# Patient Record
Sex: Male | Born: 1975 | Race: Black or African American | Hispanic: No | Marital: Single | Smoking: Former smoker
Health system: Southern US, Community
[De-identification: ages and names within clinical notes are randomized; demographics above are authoritative.]

## PROBLEM LIST (undated history)

## (undated) DIAGNOSIS — M545 Low back pain, unspecified: Secondary | ICD-10-CM

---

## 2002-02-19 ENCOUNTER — Emergency Department (HOSPITAL_COMMUNITY): Admission: EM | Admit: 2002-02-19 | Discharge: 2002-02-19 | Payer: Self-pay | Admitting: Emergency Medicine

## 2002-12-23 ENCOUNTER — Emergency Department (HOSPITAL_COMMUNITY): Admission: EM | Admit: 2002-12-23 | Discharge: 2002-12-23 | Payer: Self-pay | Admitting: Emergency Medicine

## 2003-03-18 ENCOUNTER — Emergency Department (HOSPITAL_COMMUNITY): Admission: AD | Admit: 2003-03-18 | Discharge: 2003-03-18 | Payer: Self-pay | Admitting: Family Medicine

## 2006-05-22 ENCOUNTER — Emergency Department (HOSPITAL_COMMUNITY): Admission: EM | Admit: 2006-05-22 | Discharge: 2006-05-22 | Payer: Self-pay | Admitting: Emergency Medicine

## 2008-01-13 ENCOUNTER — Emergency Department (HOSPITAL_COMMUNITY): Admission: EM | Admit: 2008-01-13 | Discharge: 2008-01-13 | Payer: Self-pay | Admitting: Emergency Medicine

## 2012-08-20 ENCOUNTER — Emergency Department (HOSPITAL_COMMUNITY)
Admission: EM | Admit: 2012-08-20 | Discharge: 2012-08-20 | Disposition: A | Discharge status: Home / Self Care | Payer: BC Managed Care – PPO | Attending: Emergency Medicine | Admitting: Emergency Medicine

## 2012-08-20 ENCOUNTER — Encounter (HOSPITAL_COMMUNITY): Payer: Self-pay | Admitting: Emergency Medicine

## 2012-08-20 ENCOUNTER — Emergency Department (HOSPITAL_COMMUNITY): Payer: BC Managed Care – PPO

## 2012-08-20 DIAGNOSIS — J329 Chronic sinusitis, unspecified: Secondary | ICD-10-CM | POA: Insufficient documentation

## 2012-08-20 DIAGNOSIS — R05 Cough: Secondary | ICD-10-CM | POA: Insufficient documentation

## 2012-08-20 DIAGNOSIS — R6883 Chills (without fever): Secondary | ICD-10-CM | POA: Insufficient documentation

## 2012-08-20 DIAGNOSIS — J029 Acute pharyngitis, unspecified: Secondary | ICD-10-CM | POA: Insufficient documentation

## 2012-08-20 DIAGNOSIS — F17200 Nicotine dependence, unspecified, uncomplicated: Secondary | ICD-10-CM | POA: Insufficient documentation

## 2012-08-20 DIAGNOSIS — R059 Cough, unspecified: Secondary | ICD-10-CM | POA: Insufficient documentation

## 2012-08-20 LAB — RAPID STREP SCREEN (MED CTR MEBANE ONLY): Streptococcus, Group A Screen (Direct): NEGATIVE

## 2012-08-20 MED ORDER — DOXYCYCLINE HYCLATE 100 MG PO TABS
100.0000 mg | ORAL_TABLET | Freq: Once | ORAL | Status: AC
  Administered 2012-08-20: 100 mg via ORAL
  Filled 2012-08-20: qty 1

## 2012-08-20 MED ORDER — FEXOFENADINE-PSEUDOEPHED ER 60-120 MG PO TB12: 1.0000 | ORAL_TABLET | Freq: Two times a day (BID) | ORAL | Status: DC

## 2012-08-20 MED ORDER — HYDROCOD POLST-CHLORPHEN POLST 10-8 MG/5ML PO LQCR
5.0000 mL | Freq: Once | ORAL | Status: AC
  Administered 2012-08-20: 5 mL via ORAL
  Filled 2012-08-20: qty 5

## 2012-08-20 MED ORDER — PROMETHAZINE-CODEINE 6.25-10 MG/5ML PO SYRP: 5.0000 mL | ORAL_SOLUTION | Freq: Four times a day (QID) | ORAL | Status: DC | PRN

## 2012-08-20 MED ORDER — DOXYCYCLINE HYCLATE 100 MG PO CAPS: 100.0000 mg | ORAL_CAPSULE | Freq: Two times a day (BID) | ORAL | Status: DC

## 2012-08-20 NOTE — ED Notes (Signed)
nad noted prior to dc. Dc instructions reviewed and 3 scripts given to pt. Ambulated out without difficulty. 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

## 2012-08-20 NOTE — ED Notes (Signed)
Pt c/o sinus congestion, nasal congestion and cough since Monday.

## 2012-08-20 NOTE — ED Provider Notes (Signed)
History     CSN: [id number]  Arrival date & time 08/20/12  2004   First MD Initiated Contact with Patient 08/20/12 2207      Chief Complaint  Patient presents with  . Nasal Congestion    (Consider location/radiation/quality/duration/timing/severity/associated sxs/prior treatment) Patient is a 37 y.o. male presenting with cough. The history is provided by the patient.  Cough Cough characteristics:  Non-productive Severity:  Moderate Duration:  4 days Timing:  Intermittent Progression:  Worsening Chronicity:  New Smoker: yes   Context: sick contacts and weather changes   Relieved by:  Nothing Worsened by:  Nothing tried Ineffective treatments:  Decongestant Associated symptoms: chills, fever, sinus congestion and sore throat   Associated symptoms: no chest pain, no eye discharge, no shortness of breath and no wheezing   Risk factors: no chemical exposure and no recent travel     History reviewed. No pertinent past medical history.  History reviewed. No pertinent past surgical history.  No family history on file.  History  Substance Use Topics  . Smoking status: Current Every Day Smoker    Types: Cigarettes  . Smokeless tobacco: Not on file  . Alcohol Use: Yes      Review of Systems  Constitutional: Positive for fever and chills. Negative for activity change.       All ROS Neg except as noted in HPI  HENT: Positive for sore throat. Negative for nosebleeds and neck pain.   Eyes: Negative for photophobia and discharge.  Respiratory: Positive for cough. Negative for shortness of breath and wheezing.   Cardiovascular: Negative for chest pain and palpitations.  Gastrointestinal: Negative for abdominal pain and blood in stool.  Genitourinary: Negative for dysuria, frequency and hematuria.  Musculoskeletal: Negative for back pain and arthralgias.  Skin: Negative.   Neurological: Negative for dizziness, seizures and speech difficulty.  Psychiatric/Behavioral: Negative  for hallucinations and confusion.    Allergies  Review of patient's allergies indicates no known allergies.  Home Medications   Current Outpatient Rx  Name  Route  Sig  Dispense  Refill  . loratadine (CLARITIN) 10 MG tablet   Oral   Take 10 mg by mouth daily as needed for allergies.           BP 130/87  Temp(Src) 99.4 F (37.4 C) (Oral)  Resp 16  Ht 5\' 8"  (1.727 m)  Wt 192 lb (87.091 kg)  BMI 29.2 kg/m2  SpO2 100%  Physical Exam  Nursing note and vitals reviewed. Constitutional: He is oriented to person, place, and time. He appears well-developed and well-nourished.  Non-toxic appearance.  HENT:  Head: Normocephalic.  Right Ear: Tympanic membrane and external ear normal.  Left Ear: Tympanic membrane and external ear normal.  Nasal congestion  Eyes: EOM and lids are normal. Pupils are equal, round, and reactive to light.  Neck: Normal range of motion. Neck supple. Carotid bruit is not present.  Cardiovascular: Normal rate, regular rhythm, normal heart sounds, intact distal pulses and normal pulses.   Pulmonary/Chest: No respiratory distress. He has rhonchi.  Abdominal: Soft. Bowel sounds are normal. There is no tenderness. There is no guarding.  Musculoskeletal: Normal range of motion.  Lymphadenopathy:       Head (right side): No submandibular adenopathy present.       Head (left side): No submandibular adenopathy present.    He has no cervical adenopathy.  Neurological: He is alert and oriented to person, place, and time. He has normal strength. No cranial nerve deficit or  sensory deficit. He exhibits normal muscle tone. Coordination normal.  Skin: Skin is warm and dry.  Psychiatric: He has a normal mood and affect. His speech is normal.    ED Course  Procedures (including critical care time)  Labs Reviewed  RAPID STREP SCREEN   Dg Chest 2 View  08/20/2012  *RADIOLOGY REPORT*  Clinical Data: Cough and night sweats  CHEST - 2 VIEW  Comparison:  January 13, 2008  Findings:  Lungs clear.  Heart size and pulmonary vascularity are normal.  No adenopathy.  No bone lesions.  IMPRESSION: No abnormality noted.   Original Report Authenticated By: Bretta Bang, M.D.      No diagnosis found.    MDM  I have reviewed nursing notes, vital signs, and all appropriate lab and imaging results for this patient. Patient presents to the emergency department with nasal congestion, cough, and sore throat with cough. He states he has had chills and fever.  Strep test is negative. Chest x-ray is read as negative. Pulse oximetry is 100% on room air. Within normal limits by my interpretation.  Plan the patient will be treated with Allegra-D, doxycycline, prednisone, promethazine cough medication. Patient to return if any changes, problems, or concerns.       Kathie Dike, PA-C 08/20/12 2255

## 2012-08-20 NOTE — ED Provider Notes (Signed)
Medical screening examination/treatment/procedure(s) were performed by non-physician practitioner and as supervising physician I was immediately available for consultation/collaboration. Devoria Albe, MD, Armando Gang   Ward Givens, MD 08/20/12 [phone]

## 2013-11-01 ENCOUNTER — Encounter (HOSPITAL_COMMUNITY): Payer: Self-pay | Admitting: Emergency Medicine

## 2013-11-01 ENCOUNTER — Emergency Department (HOSPITAL_COMMUNITY)
Admission: EM | Admit: 2013-11-01 | Discharge: 2013-11-01 | Disposition: A | Discharge status: Home / Self Care | Payer: BC Managed Care – PPO | Attending: Emergency Medicine | Admitting: Emergency Medicine

## 2013-11-01 ENCOUNTER — Emergency Department (HOSPITAL_COMMUNITY): Payer: BC Managed Care – PPO

## 2013-11-01 DIAGNOSIS — R000 Tachycardia, unspecified: Secondary | ICD-10-CM | POA: Insufficient documentation

## 2013-11-01 DIAGNOSIS — B349 Viral infection, unspecified: Secondary | ICD-10-CM

## 2013-11-01 DIAGNOSIS — R638 Other symptoms and signs concerning food and fluid intake: Secondary | ICD-10-CM | POA: Insufficient documentation

## 2013-11-01 DIAGNOSIS — B9789 Other viral agents as the cause of diseases classified elsewhere: Secondary | ICD-10-CM | POA: Insufficient documentation

## 2013-11-01 DIAGNOSIS — Z79899 Other long term (current) drug therapy: Secondary | ICD-10-CM | POA: Insufficient documentation

## 2013-11-01 DIAGNOSIS — F17200 Nicotine dependence, unspecified, uncomplicated: Secondary | ICD-10-CM | POA: Insufficient documentation

## 2013-11-01 DIAGNOSIS — R6883 Chills (without fever): Secondary | ICD-10-CM | POA: Insufficient documentation

## 2013-11-01 LAB — URINALYSIS, ROUTINE W REFLEX MICROSCOPIC
Bilirubin Urine: NEGATIVE
Glucose, UA: NEGATIVE mg/dL
Leukocytes, UA: NEGATIVE
Nitrite: NEGATIVE
Protein, ur: 30 mg/dL — AB
Specific Gravity, Urine: 1.01 (ref 1.005–1.030)
Urobilinogen, UA: 0.2 mg/dL (ref 0.0–1.0)
pH: 7 (ref 5.0–8.0)

## 2013-11-01 LAB — URINE MICROSCOPIC-ADD ON

## 2013-11-01 MED ORDER — IBUPROFEN 800 MG PO TABS
ORAL_TABLET | ORAL | Status: AC
  Filled 2013-11-01: qty 1

## 2013-11-01 MED ORDER — IBUPROFEN 800 MG PO TABS
800.0000 mg | ORAL_TABLET | Freq: Once | ORAL | Status: AC
  Administered 2013-11-01: 800 mg via ORAL

## 2013-11-01 NOTE — ED Notes (Addendum)
Felt "hot" and "sweating" since Friday and has felt like that ever since. Generalized body aches. Diarrhea 3-4 times today.

## 2013-11-01 NOTE — Discharge Instructions (Signed)
Your temperature has responded nicely to ibuprofen. Please use Tylenol extra strength every 4 hours, or ibuprofen 600 or 800 mg every 6 hours for the next 2 days. Please increase your fluids (water, juices, Gatorade, popsicles, etc.). Please use your mask until this illness has resolved. Please wash hands frequently. Please return to the emergency department if not improving. Viral Infections A virus is a type of germ. Viruses can cause:  Minor sore throats.  Aches and pains.  Headaches.  Runny nose.  Rashes.  Watery eyes.  Tiredness.  Coughs.  Loss of appetite.  Feeling sick to your stomach (nausea).  Throwing up (vomiting).  Watery poop (diarrhea). HOME CARE   Only take medicines as told by your doctor.  Drink enough water and fluids to keep your pee (urine) clear or pale yellow. Sports drinks are a good choice.  Get plenty of rest and eat healthy. Soups and broths with crackers or rice are fine. GET HELP RIGHT AWAY IF:   You have a very bad headache.  You have shortness of breath.  You have chest pain or neck pain.  You have an unusual rash.  You cannot stop throwing up.  You have watery poop that does not stop.  You cannot keep fluids down.  You or your child has a temperature by mouth above 102 F (38.9 C), not controlled by medicine.  Your baby is older than 3 months with a rectal temperature of 102 F (38.9 C) or higher.  Your baby is 473 months old or younger with a rectal temperature of 100.4 F (38 C) or higher. MAKE SURE YOU:   Understand these instructions.  Will watch this condition.  Will get help right away if you are not doing well or get worse. Document Released: 03/14/2008 Document Revised: 06/24/2011 Document Reviewed: 08/07/2010 [facility] Patient Information 2015 [employer], [place]. This information is not intended to replace advice given to you by your health care provider. Make sure you discuss any questions you have with your health  care provider.

## 2013-11-01 NOTE — ED Provider Notes (Signed)
CSN: [id number]     Arrival date & time 11/01/13  1840 History   First MD Initiated Contact with Patient 11/01/13 2059     Chief Complaint  Patient presents with  . Fever     (Consider location/radiation/quality/duration/timing/severity/associated sxs/prior Treatment) HPI Comments:  Patient states that on Friday, July 17 he was on a trip out of town when he began to not feel well. On Saturday the 18th he was unable to get out of bed because of chills and weakness. He attempted over-the-counter medications on Sunday with minimal improvement. Today he had diarrhea 3-4 times during the day. There was no blood in the diarrhea. The patient states he no he had fever but did not measure the temperature only noted he had chills. Patient states he was feeling bad before attempting to anything in from out of town. He is unsure of being around anyone who may have been ill.  Patient is a 38 y.o. male presenting with fever. The history is provided by the patient.  Fever Max temp prior to arrival:  Unsure of temp, has had chills and weakness. Associated symptoms: chills, congestion, diarrhea and myalgias   Associated symptoms: no chest pain, no confusion, no cough, no dysuria, no ear pain, no rash, no rhinorrhea, no sore throat and no vomiting     History reviewed. No pertinent past medical history. History reviewed. No pertinent past surgical history. History reviewed. No pertinent family history. History  Substance Use Topics  . Smoking status: Current Every Day Smoker    Types: Cigarettes  . Smokeless tobacco: Not on file  . Alcohol Use: Yes     Comment: occasionally    Review of Systems  Constitutional: Positive for fever, chills, activity change and appetite change.       All ROS Neg except as noted in HPI  HENT: Positive for congestion. Negative for ear pain, mouth sores, nosebleeds, rhinorrhea, sinus pressure, sneezing, sore throat and trouble swallowing.   Eyes: Negative for photophobia  and discharge.  Respiratory: Negative for cough, shortness of breath and wheezing.   Cardiovascular: Negative for chest pain and palpitations.  Gastrointestinal: Positive for diarrhea. Negative for vomiting, abdominal pain and blood in stool.  Genitourinary: Negative for dysuria, frequency and hematuria.  Musculoskeletal: Positive for myalgias. Negative for arthralgias, back pain and neck pain.  Skin: Negative.  Negative for rash.  Neurological: Negative for dizziness, seizures and speech difficulty.  Psychiatric/Behavioral: Negative for hallucinations and confusion.      Allergies  Review of patient's allergies indicates no known allergies.  Home Medications   Prior to Admission medications   Medication Sig Start Date End Date Taking? Authorizing Provider  doxycycline (VIBRAMYCIN) 100 MG capsule Take 1 capsule (100 mg total) by mouth 2 (two) times daily. 08/20/12   Kathie DikeHobson M Makyiah Lie, PA-C  fexofenadine-pseudoephedrine (ALLEGRA-D) 60-120 MG per tablet Take 1 tablet by mouth every 12 (twelve) hours. 08/20/12   Kathie DikeHobson M Myldred Raju, PA-C  loratadine (CLARITIN) 10 MG tablet Take 10 mg by mouth daily as needed for allergies.    Historical Provider, MD  promethazine-codeine (PHENERGAN WITH CODEINE) 6.25-10 MG/5ML syrup Take 5 mLs by mouth every 6 (six) hours as needed for cough. 08/20/12   Kathie DikeHobson M Asiana Benninger, PA-C   BP 123/83  Pulse 101  Temp(Src) 102.6 F (39.2 C) (Oral)  Resp 20  Ht 5\' 8"  (1.727 m)  Wt 185 lb (83.915 kg)  BMI 28.14 kg/m2  SpO2 100% Physical Exam  Nursing note and vitals reviewed. Constitutional:  He is oriented to person, place, and time. He appears well-developed and well-nourished.  Non-toxic appearance.  HENT:  Head: Normocephalic.  Right Ear: Tympanic membrane and external ear normal.  Left Ear: Tympanic membrane and external ear normal.  Mild nasal congestion present.  Minimal increased redness of the posterior pharynx. Uvula is in the midline. Airway is patent.  Eyes:  EOM and lids are normal. Pupils are equal, round, and reactive to light.  Neck: Normal range of motion. Neck supple. Carotid bruit is not present.  Cardiovascular: Regular rhythm, normal heart sounds, intact distal pulses and normal pulses.  Tachycardia present.   Pulmonary/Chest: Breath sounds normal. No respiratory distress.  Abdominal: Soft. Bowel sounds are normal. There is no tenderness. There is no guarding.  Musculoskeletal: Normal range of motion.  Lymphadenopathy:       Head (right side): No submandibular adenopathy present.       Head (left side): No submandibular adenopathy present.    He has no cervical adenopathy.  Neurological: He is alert and oriented to person, place, and time. He has normal strength. No cranial nerve deficit or sensory deficit.  Skin: Skin is warm and dry.  Psychiatric: He has a normal mood and affect. His speech is normal.    ED Course  Procedures (including critical care time) Labs Review Labs Reviewed  URINALYSIS, ROUTINE W REFLEX MICROSCOPIC    Imaging Review No results found.   EKG Interpretation None      MDM Chest x-ray is read as normal. Urine analysis is negative for urinary tract infection or severe dehydration. Patient was treated with ibuprofen 800 mg and temperature responded nicely without problem. At the time of discharge patient is ambulatory in the room and [facility] without problem. The examination, as well as vital signs are consistent with viral illness. The patient is provided with a mass, he is advised to increase fluids. The patient is also asked to wash hands frequently, and not sharing eating utensils. He is to return to the emergency department if any changes, problems, or concerns.    Final diagnoses:  None    *I have reviewed nursing notes, vital signs, and all appropriate lab and imaging results for this patient.Kathie Dike, PA-C 11/02/13 [phone]

## 2013-11-02 NOTE — ED Provider Notes (Signed)
Medical screening examination/treatment/procedure(s) were performed by non-physician practitioner and as supervising physician I was immediately available for consultation/collaboration.   EKG Interpretation None        Sean Dudley F Chastelyn Athens, MD 11/02/13 1059 

## 2015-01-05 ENCOUNTER — Emergency Department (HOSPITAL_COMMUNITY)
Admission: EM | Admit: 2015-01-05 | Discharge: 2015-01-05 | Disposition: A | Discharge status: Home / Self Care | Payer: Managed Care, Other (non HMO) | Attending: Emergency Medicine | Admitting: Emergency Medicine

## 2015-01-05 ENCOUNTER — Encounter (HOSPITAL_COMMUNITY): Payer: Self-pay | Admitting: Emergency Medicine

## 2015-01-05 DIAGNOSIS — R0981 Nasal congestion: Secondary | ICD-10-CM | POA: Diagnosis present

## 2015-01-05 DIAGNOSIS — J0100 Acute maxillary sinusitis, unspecified: Secondary | ICD-10-CM | POA: Insufficient documentation

## 2015-01-05 DIAGNOSIS — K088 Other specified disorders of teeth and supporting structures: Secondary | ICD-10-CM | POA: Insufficient documentation

## 2015-01-05 DIAGNOSIS — H9209 Otalgia, unspecified ear: Secondary | ICD-10-CM | POA: Insufficient documentation

## 2015-01-05 DIAGNOSIS — Z720 Tobacco use: Secondary | ICD-10-CM | POA: Diagnosis not present

## 2015-01-05 MED ORDER — AMOXICILLIN 250 MG PO CAPS
500.0000 mg | ORAL_CAPSULE | Freq: Once | ORAL | Status: AC
  Administered 2015-01-05: 500 mg via ORAL
  Filled 2015-01-05: qty 2

## 2015-01-05 MED ORDER — BENZONATATE 100 MG PO CAPS
200.0000 mg | ORAL_CAPSULE | Freq: Once | ORAL | Status: AC
  Administered 2015-01-05: 200 mg via ORAL
  Filled 2015-01-05: qty 2

## 2015-01-05 MED ORDER — BENZONATATE 100 MG PO CAPS: 200.0000 mg | ORAL_CAPSULE | Freq: Three times a day (TID) | ORAL | Status: DC | PRN

## 2015-01-05 MED ORDER — AMOXICILLIN 500 MG PO CAPS: 500.0000 mg | ORAL_CAPSULE | Freq: Three times a day (TID) | ORAL | Status: AC

## 2015-01-05 NOTE — ED Notes (Signed)
Patient complaining of nasal congestion x 3 days.

## 2015-01-05 NOTE — Discharge Instructions (Signed)
Sinusitis °Sinusitis is redness, soreness, and inflammation of the paranasal sinuses. Paranasal sinuses are air pockets within the bones of your face (beneath the eyes, the middle of the forehead, or above the eyes). In healthy paranasal sinuses, mucus is able to drain out, and air is able to circulate through them by way of your nose. However, when your paranasal sinuses are inflamed, mucus and air can become trapped. This can allow bacteria and other germs to grow and cause infection. °Sinusitis can develop quickly and last only a short time (acute) or continue over a long period (chronic). Sinusitis that lasts for more than 12 weeks is considered chronic.  °CAUSES  °Causes of sinusitis include: °· Allergies. °· Structural abnormalities, such as displacement of the cartilage that separates your nostrils (deviated septum), which can decrease the air flow through your nose and sinuses and affect sinus drainage. °· Functional abnormalities, such as when the small hairs (cilia) that line your sinuses and help remove mucus do not work properly or are not present. °SIGNS AND SYMPTOMS  °Symptoms of acute and chronic sinusitis are the same. The primary symptoms are pain and pressure around the affected sinuses. Other symptoms include: °· Upper toothache. °· Earache. °· Headache. °· Bad breath. °· Decreased sense of smell and taste. °· A cough, which worsens when you are lying flat. °· Fatigue. °· Fever. °· Thick drainage from your nose, which often is green and may contain pus (purulent). °· Swelling and warmth over the affected sinuses. °DIAGNOSIS  °Your health care provider will perform a physical exam. During the exam, your health care provider may: °· Look in your nose for signs of abnormal growths in your nostrils (nasal polyps). °· Tap over the affected sinus to check for signs of infection. °· View the inside of your sinuses (endoscopy) using an imaging device that has a light attached (endoscope). °If your health  care provider suspects that you have chronic sinusitis, one or more of the following tests may be recommended: °· Allergy tests. °· Nasal culture. A sample of mucus is taken from your nose, sent to a lab, and screened for bacteria. °· Nasal cytology. A sample of mucus is taken from your nose and examined by your health care provider to determine if your sinusitis is related to an allergy. °TREATMENT  °Most cases of acute sinusitis are related to a viral infection and will resolve on their own within 10 days. Sometimes medicines are prescribed to help relieve symptoms (pain medicine, decongestants, nasal steroid sprays, or saline sprays).  °However, for sinusitis related to a bacterial infection, your health care provider will prescribe antibiotic medicines. These are medicines that will help kill the bacteria causing the infection.  °Rarely, sinusitis is caused by a fungal infection. In theses cases, your health care provider will prescribe antifungal medicine. °For some cases of chronic sinusitis, surgery is needed. Generally, these are cases in which sinusitis recurs more than 3 times per year, despite other treatments. °HOME CARE INSTRUCTIONS  °· Drink plenty of water. Water helps thin the mucus so your sinuses can drain more easily. °· Use a humidifier. °· Inhale steam 3 to 4 times a day (for example, sit in the bathroom with the shower running). °· Apply a warm, moist washcloth to your face 3 to 4 times a day, or as directed by your health care provider. °· Use saline nasal sprays to help moisten and clean your sinuses. °· Take medicines only as directed by your health care provider. °·   If you were prescribed either an antibiotic or antifungal medicine, finish it all even if you start to feel better. SEEK IMMEDIATE MEDICAL CARE IF:  You have increasing pain or severe headaches.  You have nausea, vomiting, or drowsiness.  You have swelling around your face.  You have vision problems.  You have a stiff  neck.  You have difficulty breathing. MAKE SURE YOU:   Understand these instructions.  Will watch your condition.  Will get help right away if you are not doing well or get worse. Document Released: 04/01/2005 Document Revised: 08/16/2013 Document Reviewed: 04/16/2011 [facility] Patient Information 2015 [place], [place]. This information is not intended to replace advice given to you by your health care provider. Make sure you discuss any questions you have with your health care provider.  Continue using the tylenol cold and sinus medication.  You may also want to consider a Vicks vapor stick and/or menthol cough lozenges which can help you breath easier and help your sinus's clear.  Rest and make sure you are drinking plenty of fluids.  Complete your entire course of antibiotics.

## 2015-01-07 NOTE — ED Provider Notes (Signed)
CSN: [id number]     Arrival date & time 01/05/15  2033 History   First MD Initiated Contact with Patient 01/05/15 2051     Chief Complaint  Patient presents with  . Nasal Congestion     (Consider location/radiation/quality/duration/timing/severity/associated sxs/prior Treatment) The history is provided by the patient.   Sean Dudley is a 39 y.o. male who is a daily smoker, no significant past medical history presenting with a 3 day history of uri type symptoms which includes nasal congestion with subjective fever, facial and ear pain, dental pain, headache along with thick nasal discharge along with non productive cough.  Symptoms due not include dizziness, ear discharge, shortness of breath, chest pain,  Nausea, vomiting or diarrhea.  The patient has taken tylenol sinus formula prior to arrival with no significant improvement in symptoms.      History reviewed. No pertinent past medical history. History reviewed. No pertinent past surgical history. History reviewed. No pertinent family history. Social History  Substance Use Topics  . Smoking status: Current Every Day Smoker    Types: Cigarettes  . Smokeless tobacco: None  . Alcohol Use: Yes     Comment: occasionally    Review of Systems  Constitutional: Positive for fever.  HENT: Positive for congestion, ear pain, rhinorrhea and sinus pressure. Negative for sore throat, trouble swallowing and voice change.   Eyes: Negative for pain and discharge.  Respiratory: Negative for cough, shortness of breath, wheezing and stridor.   Cardiovascular: Negative for chest pain.  Gastrointestinal: Negative for abdominal pain.  Genitourinary: Negative.       Allergies  Review of patient's allergies indicates no known allergies.  Home Medications   Prior to Admission medications   Medication Sig Start Date End Date Taking? Authorizing Provider  pseudoephedrine-acetaminophen (TYLENOL SINUS) 30-500 MG TABS Take 1 tablet by mouth  every 4 (four) hours as needed.   Yes Historical Provider, MD  amoxicillin (AMOXIL) 500 MG capsule Take 1 capsule (500 mg total) by mouth 3 (three) times daily. 01/05/15 01/15/15  Burgess Amor, PA-C  benzonatate (TESSALON) 100 MG capsule Take 2 capsules (200 mg total) by mouth 3 (three) times daily as needed. 01/05/15   Burgess Amor, PA-C   BP 127/82 mmHg  Pulse 88  Temp(Src) 98.3 F (36.8 C) (Oral)  Resp 16  Ht  (1.727 m)  Wt 190 lb (86.183 kg)  BMI 28.90 kg/m2  SpO2 100% Physical Exam  Constitutional: He is oriented to person, place, and time. He appears well-developed and well-nourished.  HENT:  Head: Normocephalic and atraumatic.  Right Ear: Tympanic membrane, external ear and ear canal normal.  Left Ear: Tympanic membrane, external ear and ear canal normal.  Nose: Mucosal edema and rhinorrhea present. Right sinus exhibits maxillary sinus tenderness. Left sinus exhibits maxillary sinus tenderness.  Mouth/Throat: Uvula is midline, oropharynx is clear and moist and mucous membranes are normal. Normal dentition. No oropharyngeal exudate, posterior oropharyngeal edema, posterior oropharyngeal erythema or tonsillar abscesses.  Eyes: Conjunctivae are normal.  Cardiovascular: Normal rate and normal heart sounds.   Pulmonary/Chest: Effort normal. No respiratory distress. He has no wheezes. He has no rales.  Musculoskeletal: Normal range of motion.  Neurological: He is alert and oriented to person, place, and time.  Skin: Skin is warm and dry. No rash noted.    ED Course  Procedures (including critical care time) Labs Review Labs Reviewed - No data to display  Imaging Review No results found. I have personally reviewed and evaluated these  images and lab results as part of my medical decision-making.   EKG Interpretation None      MDM   Final diagnoses:  Acute maxillary sinusitis, recurrence not specified    Pt encouraged to continue taking his decongestant.  Can also try  vicks vapor stick, menthol lozenges, steam.  Prescribed amoxil for sinusitis, tessalon for cough reduction.  Encouraged rest, increased fluid intake, recheck if sx do not improve with tx. \ The patient appears reasonably screened and/or stabilized for discharge and I doubt any other medical condition or other [facility] requiring further screening, evaluation, or treatment in the ED at this time prior to discharge.     Burgess Amor, PA-C 01/07/15 2121  Vanetta Mulders, MD 01/12/15 [phone]

## 2015-01-29 ENCOUNTER — Emergency Department (HOSPITAL_COMMUNITY)
Admission: EM | Admit: 2015-01-29 | Discharge: 2015-01-29 | Disposition: A | Discharge status: Home / Self Care | Payer: Managed Care, Other (non HMO) | Attending: Emergency Medicine | Admitting: Emergency Medicine

## 2015-01-29 ENCOUNTER — Encounter (HOSPITAL_COMMUNITY): Payer: Self-pay | Admitting: Emergency Medicine

## 2015-01-29 DIAGNOSIS — Z720 Tobacco use: Secondary | ICD-10-CM | POA: Insufficient documentation

## 2015-01-29 DIAGNOSIS — M7750 Other enthesopathy of unspecified foot: Secondary | ICD-10-CM

## 2015-01-29 DIAGNOSIS — R61 Generalized hyperhidrosis: Secondary | ICD-10-CM | POA: Diagnosis not present

## 2015-01-29 DIAGNOSIS — M79671 Pain in right foot: Secondary | ICD-10-CM | POA: Diagnosis present

## 2015-01-29 DIAGNOSIS — M779 Enthesopathy, unspecified: Secondary | ICD-10-CM | POA: Diagnosis not present

## 2015-01-29 MED ORDER — DICLOFENAC SODIUM 75 MG PO TBEC: 75.0000 mg | DELAYED_RELEASE_TABLET | Freq: Two times a day (BID) | ORAL | Status: DC
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
## 2015-01-31 NOTE — ED Provider Notes (Signed)
CSN: [id number]     Arrival date & time 01/29/15  1925 History   First MD Initiated Contact with Patient 01/29/15 1947     Chief Complaint  Patient presents with  . Foot Pain     (Consider location/radiation/quality/duration/timing/severity/associated sxs/prior Treatment) HPI   Sean Dudley is a 39 y.o. male who presents to the Emergency Department complaining of recurrent right foot pain.  Pain has been worsening for several days.  He describes a sharp pain to the foot that radiates to the ankle with flexion and weight bearing.  Pain improves at rest.  He has tried OTC analgesics without relief.  He denies recent injury, swelling, redness, open wounds, and pain proximal to the ankle.    History reviewed. No pertinent past medical history. History reviewed. No pertinent past surgical history. History reviewed. No pertinent family history. Social History  Substance Use Topics  . Smoking status: Current Every Day Smoker    Types: Cigarettes  . Smokeless tobacco: None  . Alcohol Use: Yes     Comment: occasionally    Review of Systems  Constitutional: Negative for fever and chills.  Musculoskeletal: Positive for arthralgias (right foot and ankle pain). Negative for joint swelling.  Skin: Negative for color change and wound.  Neurological: Negative for weakness and numbness.  All other systems reviewed and are negative.     Allergies  Review of patient's allergies indicates no known allergies.  Home Medications   Prior to Admission medications   Medication Sig Start Date End Date Taking? Authorizing Provider  benzonatate (TESSALON) 100 MG capsule Take 2 capsules (200 mg total) by mouth 3 (three) times daily as needed. 01/05/15   Burgess Amor, PA-C  diclofenac (VOLTAREN) 75 MG EC tablet Take 1 tablet (75 mg total) by mouth 2 (two) times daily. Take with food 01/29/15   Irie Dowson, PA-C  pseudoephedrine-acetaminophen (TYLENOL SINUS) 30-500 MG TABS Take 1 tablet by mouth  every 4 (four) hours as needed.    Historical Provider, MD   BP 113/79 mmHg  Pulse 77  Temp(Src) 98.2 F (36.8 C) (Oral)  Resp 14  Ht  (1.727 m)  Wt 185 lb (83.915 kg)  BMI 28.14 kg/m2  SpO2 100% Physical Exam  Constitutional: He is oriented to person, place, and time. He appears well-developed and well-nourished. No distress.  Cardiovascular: Normal rate, regular rhythm and intact distal pulses.   No murmur heard. Pulmonary/Chest: Effort normal and breath sounds normal. No respiratory distress.  Musculoskeletal: Normal range of motion. He exhibits tenderness. He exhibits no edema.       Right foot: There is tenderness. There is no bony tenderness, no swelling, normal capillary refill, no crepitus and no deformity.       Feet:  ttp of the dorsal right foot.  No edema or erythema  Neurological: He is alert and oriented to person, place, and time. Coordination normal.  Skin: Skin is warm. He is diaphoretic.  Psychiatric: He has a normal mood and affect.  Nursing note and vitals reviewed.   ED Course  Procedures (including critical care time) Labs Review Labs Reviewed - No data to display  Imaging Review No results found. I have personally reviewed and evaluated these images and lab results as part of my medical decision-making.   EKG Interpretation None      MDM   Final diagnoses:  Tendonitis of foot    Pain to the right foot likely related to tendonitis of foot.  No concerning sx's  for infectious process.  NV intact.  Agrees to symptomatic tx and podiatry referral  Pauline Ausammy Elisha Cooksey, PA-C 01/31/15 1537  Samuel JesterKathleen McManus, DO 02/01/15 2237

## 2015-04-17 ENCOUNTER — Emergency Department (HOSPITAL_COMMUNITY)
Admission: EM | Admit: 2015-04-17 | Discharge: 2015-04-17 | Disposition: A | Discharge status: Home / Self Care | Payer: Managed Care, Other (non HMO) | Attending: Emergency Medicine | Admitting: Emergency Medicine

## 2015-04-17 ENCOUNTER — Emergency Department (HOSPITAL_COMMUNITY): Payer: Managed Care, Other (non HMO)

## 2015-04-17 ENCOUNTER — Encounter (HOSPITAL_COMMUNITY): Payer: Self-pay

## 2015-04-17 DIAGNOSIS — F17210 Nicotine dependence, cigarettes, uncomplicated: Secondary | ICD-10-CM | POA: Diagnosis not present

## 2015-04-17 DIAGNOSIS — Y9389 Activity, other specified: Secondary | ICD-10-CM | POA: Insufficient documentation

## 2015-04-17 DIAGNOSIS — Y990 Civilian activity done for income or pay: Secondary | ICD-10-CM | POA: Insufficient documentation

## 2015-04-17 DIAGNOSIS — Z791 Long term (current) use of non-steroidal anti-inflammatories (NSAID): Secondary | ICD-10-CM | POA: Diagnosis not present

## 2015-04-17 DIAGNOSIS — S39012A Strain of muscle, fascia and tendon of lower back, initial encounter: Secondary | ICD-10-CM

## 2015-04-17 DIAGNOSIS — S3992XA Unspecified injury of lower back, initial encounter: Secondary | ICD-10-CM | POA: Diagnosis present

## 2015-04-17 DIAGNOSIS — X500XXA Overexertion from strenuous movement or load, initial encounter: Secondary | ICD-10-CM | POA: Diagnosis not present

## 2015-04-17 DIAGNOSIS — Y9289 Other specified places as the place of occurrence of the external cause: Secondary | ICD-10-CM | POA: Insufficient documentation

## 2015-04-17 MED ORDER — KETOROLAC TROMETHAMINE 30 MG/ML IJ SOLN
30.0000 mg | Freq: Once | INTRAMUSCULAR | Status: AC
  Administered 2015-04-17: 30 mg via INTRAMUSCULAR

## 2015-04-17 MED ORDER — KETOROLAC TROMETHAMINE 30 MG/ML IJ SOLN
INTRAMUSCULAR | Status: AC
  Filled 2015-04-17: qty 1

## 2015-04-17 MED ORDER — IBUPROFEN 600 MG PO TABS: 600.0000 mg | ORAL_TABLET | Freq: Three times a day (TID) | ORAL | Status: DC | PRN

## 2015-04-17 NOTE — ED Provider Notes (Signed)
CSN: [id number]     Arrival date & time 04/17/15  0044 History  By signing my name below, I, Sean Dudley, attest that this documentation has been prepared under the direction and in the presence of Sean Rhineonald Aubert Choyce, MD. Electronically Signed: Budd PalmerVanessa Dudley, ED Scribe. 04/17/2015. 1:01 AM.      Chief Complaint  Patient presents with  . Back Pain   Patient is a 40 y.o. male presenting with back pain. The history is provided by the patient. No language interpreter was used.  Back Pain Location:  Lumbar spine and sacro-iliac joint Quality:  Aching Radiates to:  Does not radiate Pain severity:  Moderate Onset quality:  Gradual Timing:  Constant Progression:  Worsening Chronicity:  New Context: lifting heavy objects and physical stress   Context: not recent injury   Relieved by:  Nothing Worsened by:  Bending, deep breathing, movement and twisting Ineffective treatments:  NSAIDs Associated symptoms: no abdominal pain, no chest pain, no numbness and no tingling    HPI Comments: Sean Dudley is a 40 y.o. male smoker who presents to the Emergency Department complaining of constant, left-sided mid-back soreness onset tonight. Pt states the pain began when he woke up tonight. He then took two tylenol and went to work, but was unable to continue working due to the worsening pain. He notes while at work he has to do a lot of lifting and bending over. He also notes exacerbation with deep breathing and moving in general. He states he has been working the past three days and had no pain until today. He denies any recent trauma or injury to the area. Pt denies fever, n/v/d, SOB, cough, fever, abdominal pain, leg weakness, and bladder or bowel incontinence.   PMH - none Social History  Substance Use Topics  . Smoking status: Current Every Day Smoker    Types: Cigarettes  . Smokeless tobacco: None  . Alcohol Use: Yes     Comment: occasionally    Review of Systems  Respiratory: Negative for  shortness of breath.   Cardiovascular: Negative for chest pain.  Gastrointestinal: Negative for abdominal pain.  Musculoskeletal: Positive for back pain.  Neurological: Negative for tingling and numbness.  All other systems reviewed and are negative.   Allergies  Review of patient's allergies indicates no known allergies.  Home Medications   Prior to Admission medications   Medication Sig Start Date End Date Taking? Authorizing Provider  benzonatate (TESSALON) 100 MG capsule Take 2 capsules (200 mg total) by mouth 3 (three) times daily as needed. 01/05/15   Burgess AmorJulie Idol, PA-C  diclofenac (VOLTAREN) 75 MG EC tablet Take 1 tablet (75 mg total) by mouth 2 (two) times daily. Take with food 01/29/15   Tammy Triplett, PA-C  pseudoephedrine-acetaminophen (TYLENOL SINUS) 30-500 MG TABS Take 1 tablet by mouth every 4 (four) hours as needed.    Historical Provider, MD   BP 129/94 mmHg  Pulse 59  Temp(Src) 97.8 F (36.6 C) (Oral)  Resp 20  Ht 5\' 8"  (1.727 m)  Wt 185 lb (83.915 kg)  BMI 28.14 kg/m2  SpO2 100% Physical Exam CONSTITUTIONAL: Well developed/well nourished HEAD: Normocephalic/atraumatic ENMT: Mucous membranes moist NECK: supple no meningeal signs SPINE/BACK:entire spine nontender,  para-thoracic TTP, No bruising/crepitance/stepoffs noted to spine CV: S1/S2 noted, no murmurs/rubs/gallops noted LUNGS: Lungs are clear to auscultation bilaterally, no apparent distress ABDOMEN: soft, nontender, no rebound or guarding GU:no cva tenderness NEURO: Awake/alert, equal motor 5/5 strength noted with the following: hip flexion/knee flexion/extension, foot  dorsi/plantar flexion, great toe extension intact bilaterally, plantar reflex appropriate (toes downgoing), no sensory deficit in any dermatome.  Equal patellar/achilles reflex noted (2+) in bilateral lower extremities.  Pt is able to ambulate unassisted. EXTREMITIES: pulses normal, full ROM SKIN: warm, color normal PSYCH: no abnormalities  of mood noted, alert and oriented to situation    ED Course  Procedures  DIAGNOSTIC STUDIES: Oxygen Saturation is 100% on RA, normal by my interpretation.    COORDINATION OF CARE: 12:55 AM - Discussed probable muscle strain. Discussed plans to order a shot of an anti-inflammatory as well as an XR. Pt advised of plan for treatment and pt agrees.   Pt well appearing He is ambulatory His pain was in para-thoracic/posterior chest wall region It was worse with movement/palpation CXR negative I doubt PE as cause of pain (he appears PERC negative) Stable for d/c home  Imaging Review Dg Chest 2 View  04/17/2015  CLINICAL DATA:  Left-sided chest pain. EXAM: CHEST  2 VIEW COMPARISON:  11/01/2013 FINDINGS: The cardiomediastinal contours are normal. The lungs are clear. Pulmonary vasculature is normal. No consolidation, pleural effusion, or pneumothorax. No acute osseous abnormalities are seen. IMPRESSION: No acute pulmonary process. Electronically Signed   By: Rubye Oaks M.D.   On: 04/17/2015 01:31   I have personally reviewed and evaluated these images  results as part of my medical decision-making.   Medications  ketorolac (TORADOL) 30 MG/ML injection 30 mg (30 mg Intramuscular Given 04/17/15 0124)    MDM   Final diagnoses:  Back strain, initial encounter    Nursing notes including past medical history and social history reviewed and considered in documentation xrays/imaging reviewed by myself and considered during evaluation   I personally performed the services described in this documentation, which was scribed in my presence. The recorded information has been reviewed and is accurate.       Sean Rhine, MD 04/17/15 [phone]

## 2015-04-17 NOTE — Discharge Instructions (Signed)

## 2015-04-17 NOTE — ED Notes (Signed)
Pt c/o lower back pain, denies recent injury or trauma

## 2015-05-26 ENCOUNTER — Emergency Department (HOSPITAL_COMMUNITY)
Admission: EM | Admit: 2015-05-26 | Discharge: 2015-05-26 | Disposition: A | Discharge status: Home / Self Care | Payer: Managed Care, Other (non HMO) | Attending: Emergency Medicine | Admitting: Emergency Medicine

## 2015-05-26 ENCOUNTER — Encounter (HOSPITAL_COMMUNITY): Payer: Self-pay | Admitting: Emergency Medicine

## 2015-05-26 DIAGNOSIS — F17210 Nicotine dependence, cigarettes, uncomplicated: Secondary | ICD-10-CM | POA: Diagnosis not present

## 2015-05-26 DIAGNOSIS — J029 Acute pharyngitis, unspecified: Secondary | ICD-10-CM | POA: Insufficient documentation

## 2015-05-26 NOTE — Discharge Instructions (Signed)
RECOMMEND ADVIL COLD AND SINUS MEDICATION FOR SYMPTOMS. ALSO RECOMMEND PLAIN SALINE NASAL SPRAYS FOR ADDITIONAL RELIEF. PUSH FLUIDS.    Pharyngitis Pharyngitis is redness, pain, and swelling (inflammation) of your pharynx.  CAUSES  Pharyngitis is usually caused by infection. Most of the time, these infections are from viruses (viral) and are part of a cold. However, sometimes pharyngitis is caused by bacteria (bacterial). Pharyngitis can also be caused by allergies. Viral pharyngitis may be spread from person to person by coughing, sneezing, and personal items or utensils (cups, forks, spoons, toothbrushes). Bacterial pharyngitis may be spread from person to person by more intimate contact, such as kissing.  SIGNS AND SYMPTOMS  Symptoms of pharyngitis include:   Sore throat.   Tiredness (fatigue).   Low-grade fever.   Headache.  Joint pain and muscle aches.  Skin rashes.  Swollen lymph nodes.  Plaque-like film on throat or tonsils (often seen with bacterial pharyngitis). DIAGNOSIS  Your health care provider will ask you questions about your illness and your symptoms. Your medical history, along with a physical exam, is often all that is needed to diagnose pharyngitis. Sometimes, a rapid strep test is done. Other lab tests may also be done, depending on the suspected cause.  TREATMENT  Viral pharyngitis will usually get better in 3-4 days without the use of medicine. Bacterial pharyngitis is treated with medicines that kill germs (antibiotics).  HOME CARE INSTRUCTIONS   Drink enough water and fluids to keep your urine clear or pale yellow.   Only take over-the-counter or prescription medicines as directed by your health care provider:   If you are prescribed antibiotics, make sure you finish them even if you start to feel better.   Do not take aspirin.   Get lots of rest.   Gargle with 8 oz of salt water ( tsp of salt per 1 qt of water) as often as every 1-2 hours to  soothe your throat.   Throat lozenges (if you are not at risk for choking) or sprays may be used to soothe your throat. SEEK MEDICAL CARE IF:   You have large, tender lumps in your neck.  You have a rash.  You cough up green, yellow-brown, or bloody spit. SEEK IMMEDIATE MEDICAL CARE IF:   Your neck becomes stiff.  You drool or are unable to swallow liquids.  You vomit or are unable to keep medicines or liquids down.  You have severe pain that does not go away with the use of recommended medicines.  You have trouble breathing (not caused by a stuffy nose). MAKE SURE YOU:   Understand these instructions.  Will watch your condition.  Will get help right away if you are not doing well or get worse.   This information is not intended to replace advice given to you by your health care provider. Make sure you discuss any questions you have with your health care provider.   Document Released: 04/01/2005 Document Revised: 01/20/2013 Document Reviewed: 12/07/2012 Elsevier Interactive Patient Education [employer]

## 2015-05-26 NOTE — ED Notes (Signed)
Pt states he has a sore throat, cold sweats during the day, chills  Pt states he has been taking advil but it is not helping

## 2015-06-01 NOTE — ED Provider Notes (Signed)
CSN: [id number]     Arrival date & time 05/26/15  0238 History   First MD Initiated Contact with Patient 05/26/15 0309     Chief Complaint  Patient presents with  . Sore Throat     (Consider location/radiation/quality/duration/timing/severity/associated sxs/prior Treatment) Patient is a 40 y.o. male presenting with pharyngitis. The history is provided by the patient. No language interpreter was used.  Sore Throat This is a new problem. The current episode started today. The problem has been unchanged. Associated symptoms include chills and a sore throat. Pertinent negatives include no congestion, coughing, fever, nausea, rash or vomiting. The symptoms are aggravated by swallowing. He has tried NSAIDs for the symptoms.    History reviewed. No pertinent past medical history. History reviewed. No pertinent past surgical history. Family History  Problem Relation Age of Onset  . Hypertension Father   . Diabetes Other   . Hypertension Other    Social History  Substance Use Topics  . Smoking status: Current Every Day Smoker    Types: Cigarettes  . Smokeless tobacco: None  . Alcohol Use: Yes     Comment: occasionally    Review of Systems  Constitutional: Positive for chills. Negative for fever.  HENT: Positive for sore throat. Negative for congestion.   Respiratory: Negative for cough.   Gastrointestinal: Negative for nausea and vomiting.  Musculoskeletal: Negative for neck stiffness.  Skin: Negative for rash.      Allergies  Review of patient's allergies indicates no known allergies.  Home Medications   Prior to Admission medications   Medication Sig Start Date End Date Taking? Authorizing Provider  ibuprofen (ADVIL,MOTRIN) 200 MG tablet Take 200 mg by mouth every 6 (six) hours as needed for moderate pain.   Yes Historical Provider, MD  ibuprofen (ADVIL,MOTRIN) 600 MG tablet Take 1 tablet (600 mg total) by mouth every 8 (eight) hours as needed for moderate pain. Patient  not taking: Reported on 05/26/2015 04/17/15   Zadie Rhine, MD   BP 122/84 mmHg  Pulse 76  Temp(Src) 98.6 F (37 C) (Oral)  Resp 18  SpO2 100% Physical Exam  Constitutional: He is oriented to person, place, and time. He appears well-developed and well-nourished.  HENT:  Head: Normocephalic.  Mouth/Throat: Uvula is midline. Mucous membranes are not dry. Posterior oropharyngeal erythema present. No oropharyngeal exudate, posterior oropharyngeal edema or tonsillar abscesses.  Neck: Normal range of motion. Neck supple.  Cardiovascular: Normal rate and regular rhythm.   Pulmonary/Chest: Effort normal and breath sounds normal. He has no wheezes.  Abdominal: Soft. Bowel sounds are normal. There is no tenderness. There is no rebound and no guarding.  Musculoskeletal: Normal range of motion.  Neurological: He is alert and oriented to person, place, and time.  Skin: Skin is warm and dry. No rash noted.  Psychiatric: He has a normal mood and affect.    ED Course  Procedures (including critical care time) Labs Review Labs Reviewed - No data to display  Imaging Review No results found. I have personally reviewed and evaluated these images and lab results as part of my medical decision-making.   EKG Interpretation None      MDM   Final diagnoses:  Pharyngitis    Uncomplicated sore throat that is non-exudative, likely viral.     Elpidio Anis, PA-C 06/01/15 0704  April Palumbo, MD 06/02/15 0140

## 2015-07-02 ENCOUNTER — Encounter (HOSPITAL_COMMUNITY): Payer: Self-pay

## 2015-07-02 ENCOUNTER — Emergency Department (HOSPITAL_COMMUNITY)
Admission: EM | Admit: 2015-07-02 | Discharge: 2015-07-02 | Disposition: A | Discharge status: Home / Self Care | Payer: Managed Care, Other (non HMO) | Attending: Emergency Medicine | Admitting: Emergency Medicine

## 2015-07-02 DIAGNOSIS — Z791 Long term (current) use of non-steroidal anti-inflammatories (NSAID): Secondary | ICD-10-CM | POA: Diagnosis not present

## 2015-07-02 DIAGNOSIS — R000 Tachycardia, unspecified: Secondary | ICD-10-CM | POA: Insufficient documentation

## 2015-07-02 DIAGNOSIS — J111 Influenza due to unidentified influenza virus with other respiratory manifestations: Secondary | ICD-10-CM

## 2015-07-02 DIAGNOSIS — R05 Cough: Secondary | ICD-10-CM | POA: Diagnosis present

## 2015-07-02 DIAGNOSIS — F17210 Nicotine dependence, cigarettes, uncomplicated: Secondary | ICD-10-CM | POA: Diagnosis not present

## 2015-07-02 MED ORDER — OXYMETAZOLINE HCL 0.05 % NA SOLN
1.0000 | Freq: Once | NASAL | Status: AC
  Administered 2015-07-02: 1 via NASAL
  Filled 2015-07-02: qty 15

## 2015-07-02 MED ORDER — ACETAMINOPHEN-CODEINE #3 300-30 MG PO TABS: 1.0000 | ORAL_TABLET | Freq: Four times a day (QID) | ORAL | Status: DC | PRN

## 2015-07-02 MED ORDER — ACETAMINOPHEN 500 MG PO TABS
1000.0000 mg | ORAL_TABLET | Freq: Once | ORAL | Status: AC
  Administered 2015-07-02: 1000 mg via ORAL
  Filled 2015-07-02: qty 2

## 2015-07-02 NOTE — Discharge Instructions (Signed)
Your examination favors influenza. Please wash hands frequently. Please increase water, juices, Gatorade, correlates, etc. Please use your mask until symptoms have resolved. Use 1 spray of Afrin to each nostril every 8 hours for 5 days only. Use ibuprofen/Advil every 6 hours, or Tylenol every 4 hours for aching and fever and chills. Influenza, Adult Influenza ("the flu") is a viral infection of the respiratory tract. It occurs more often in winter months because people spend more time in close contact with one another. Influenza can make you feel very sick. Influenza easily spreads from person to person (contagious). CAUSES  Influenza is caused by a virus that infects the respiratory tract. You can catch the virus by breathing in droplets from an infected person's cough or sneeze. You can also catch the virus by touching something that was recently contaminated with the virus and then touching your mouth, nose, or eyes. RISKS AND COMPLICATIONS You may be at risk for a more severe case of influenza if you smoke cigarettes, have diabetes, have chronic heart disease (such as heart failure) or lung disease (such as asthma), or if you have a weakened immune system. Elderly people and pregnant women are also at risk for more serious infections. The most common problem of influenza is a lung infection (pneumonia). Sometimes, this problem can require emergency medical care and may be life threatening. SIGNS AND SYMPTOMS  Symptoms typically last 4 to 10 days and may include:  Fever.  Chills.  Headache, body aches, and muscle aches.  Sore throat.  Chest discomfort and cough.  Poor appetite.  Weakness or feeling tired.  Dizziness.  Nausea or vomiting. DIAGNOSIS  Diagnosis of influenza is often made based on your history and a physical exam. A nose or throat swab test can be done to confirm the diagnosis. TREATMENT  In mild cases, influenza goes away on its own. Treatment is directed at relieving  symptoms. For more severe cases, your health care provider may prescribe antiviral medicines to shorten the sickness. Antibiotic medicines are not effective because the infection is caused by a virus, not by bacteria. HOME CARE INSTRUCTIONS  Take medicines only as directed by your health care provider.  Use a cool mist humidifier to make breathing easier.  Get plenty of rest until your temperature returns to normal. This usually takes 3 to 4 days.  Drink enough fluid to keep your urine clear or pale yellow.  Cover yourmouth and nosewhen coughing or sneezing,and wash your handswellto prevent thevirusfrom spreading.  Stay homefromwork orschool untilthe fever is gonefor at least 471full day. PREVENTION  An annual influenza vaccination (flu shot) is the best way to avoid getting influenza. An annual flu shot is now routinely recommended for all adults in the [place] SEEK MEDICAL CARE IF:  You experiencechest pain, yourcough worsens,or you producemore mucus.  Youhave nausea,vomiting, ordiarrhea.  Your fever returns or gets worse. SEEK IMMEDIATE MEDICAL CARE IF:  You havetrouble breathing, you become short of breath,or your skin ornails becomebluish.  You have severe painor stiffnessin the neck.  You develop a sudden headache, or pain in the face or ear.  You have nausea or vomiting that you cannot control. MAKE SURE YOU:   Understand these instructions.  Will watch your condition.  Will get help right away if you are not doing well or get worse.   This information is not intended to replace advice given to you by your health care provider. Make sure you discuss any questions you have with your health care  provider.   Document Released: 03/29/2000 Document Revised: 04/22/2014 Document Reviewed: 07/01/2011 Elsevier Interactive Patient Education [employer].

## 2015-07-02 NOTE — ED Notes (Signed)
Patient c/o sinus congestion, cough, and body aches .

## 2015-07-02 NOTE — ED Provider Notes (Signed)
CSN: [id number]     Arrival date & time 07/02/15  2049 History   First MD Initiated Contact with Patient 07/02/15 2211     Chief Complaint  Patient presents with  . Cough     (Consider location/radiation/quality/duration/timing/severity/associated sxs/prior Treatment) Patient is a 40 y.o. male presenting with URI.  URI Presenting symptoms: congestion and cough   Presenting symptoms comment:  Weakness and feeling bad. Severity:  Moderate Onset quality:  Gradual Duration:  2 days Timing:  Intermittent Progression:  Worsening Chronicity:  New Relieved by:  Nothing Associated symptoms: headaches, myalgias and sneezing   Risk factors: sick contacts   Risk factors: no chronic kidney disease, no chronic respiratory disease, no immunosuppression and no recent travel     History reviewed. No pertinent past medical history. History reviewed. No pertinent past surgical history. Family History  Problem Relation Age of Onset  . Hypertension Father   . Diabetes Other   . Hypertension Other    Social History  Substance Use Topics  . Smoking status: Current Every Day Smoker    Types: Cigarettes  . Smokeless tobacco: None  . Alcohol Use: Yes     Comment: occasionally    Review of Systems  HENT: Positive for congestion and sneezing.   Respiratory: Positive for cough.   Musculoskeletal: Positive for myalgias.  Neurological: Positive for headaches.  All other systems reviewed and are negative.     Allergies  Review of patient's allergies indicates no known allergies.  Home Medications   Prior to Admission medications   Medication Sig Start Date End Date Taking? Authorizing Provider  Pseudoephedrine-Ibuprofen (ADVIL COLD/SINUS PO) Take 2 tablets by mouth every 6 (six) hours as needed (cold/sinus).   Yes Historical Provider, MD  ibuprofen (ADVIL,MOTRIN) 200 MG tablet Take 200 mg by mouth every 6 (six) hours as needed for moderate pain.    Historical Provider, MD  ibuprofen  (ADVIL,MOTRIN) 600 MG tablet Take 1 tablet (600 mg total) by mouth every 8 (eight) hours as needed for moderate pain. Patient not taking: Reported on 05/26/2015 04/17/15   Zadie Rhine, MD   BP 134/77 mmHg  Pulse 101  Temp(Src) 99.6 F (37.6 C) (Oral)  Resp 16  Ht  (1.727 m)  Wt 83.915 kg  BMI 28.14 kg/m2  SpO2 100% Physical Exam  Constitutional: He is oriented to person, place, and time. He appears well-developed and well-nourished.  Non-toxic appearance.  HENT:  Head: Normocephalic.  Right Ear: Tympanic membrane and external ear normal.  Left Ear: Tympanic membrane and external ear normal.  Nasal congestion present.  Eyes: EOM and lids are normal. Pupils are equal, round, and reactive to light.  Neck: Normal range of motion. Neck supple. Carotid bruit is not present.  Cardiovascular: Regular rhythm, normal heart sounds, intact distal pulses and normal pulses.  Tachycardia present.   Pulmonary/Chest: Breath sounds normal. No respiratory distress. He has no wheezes.  Abdominal: Soft. Bowel sounds are normal. There is no tenderness. There is no guarding.  Musculoskeletal: Normal range of motion. He exhibits no tenderness.  Lymphadenopathy:       Head (right side): No submandibular adenopathy present.       Head (left side): No submandibular adenopathy present.    He has no cervical adenopathy.  Neurological: He is alert and oriented to person, place, and time. He has normal strength. No cranial nerve deficit or sensory deficit.  Skin: Skin is warm and dry. No rash noted.  Psychiatric: He has a normal mood  and affect. His speech is normal.  Nursing note and vitals reviewed.   ED Course  Procedures (including critical care time) Labs Review Labs Reviewed - No data to display  Imaging Review No results found. I have personally reviewed and evaluated these images and lab results as part of my medical decision-making.   EKG Interpretation None      MDM  Vital signs  reviewed. Exam favors influenza. Discussed findings with the patient in terms which he understands. The patient states that he is trying Tylenol and ibuprofen and these are not working for severe body aches. The patient will be treated with Afrin spray for congestion. He is asked to increase fluids. He is asked to continue his Tylenol and ibuprofen. He is given a prescription for Tylenol codeine for more severe aching pain. We discussed importance of good handwashing, and good hydration. The patient is to return to the emergency department for evaluation if not improving. Patient is in agreement with this plan.    Final diagnoses:  Influenza    *I have reviewed nursing notes, vital signs, and all appropriate lab and imaging results for this patient.    Kourtlyn Charlet BryantIvery Quale, PA-C 07/04/15 1025  Raeford RazorStephen Kohut, MD 07/06/15 2233

## 2015-12-07 ENCOUNTER — Encounter (HOSPITAL_COMMUNITY): Payer: Self-pay | Admitting: Emergency Medicine

## 2015-12-07 ENCOUNTER — Emergency Department (HOSPITAL_COMMUNITY)
Admission: EM | Admit: 2015-12-07 | Discharge: 2015-12-07 | Disposition: A | Discharge status: Home / Self Care | Payer: Managed Care, Other (non HMO) | Attending: Emergency Medicine | Admitting: Emergency Medicine

## 2015-12-07 ENCOUNTER — Emergency Department (HOSPITAL_COMMUNITY): Payer: Managed Care, Other (non HMO)

## 2015-12-07 DIAGNOSIS — Z79899 Other long term (current) drug therapy: Secondary | ICD-10-CM | POA: Insufficient documentation

## 2015-12-07 DIAGNOSIS — M19171 Post-traumatic osteoarthritis, right ankle and foot: Secondary | ICD-10-CM | POA: Insufficient documentation

## 2015-12-07 DIAGNOSIS — Z791 Long term (current) use of non-steroidal anti-inflammatories (NSAID): Secondary | ICD-10-CM | POA: Diagnosis not present

## 2015-12-07 DIAGNOSIS — F17210 Nicotine dependence, cigarettes, uncomplicated: Secondary | ICD-10-CM | POA: Diagnosis not present

## 2015-12-07 DIAGNOSIS — M79671 Pain in right foot: Secondary | ICD-10-CM

## 2015-12-07 DIAGNOSIS — M25571 Pain in right ankle and joints of right foot: Secondary | ICD-10-CM | POA: Diagnosis present

## 2015-12-07 MED ORDER — TRAMADOL HCL 50 MG PO TABS: 50.0000 mg | ORAL_TABLET | Freq: Four times a day (QID) | ORAL | 0 refills | Status: DC | PRN

## 2015-12-07 MED ORDER — TRAMADOL HCL 50 MG PO TABS
50.0000 mg | ORAL_TABLET | Freq: Once | ORAL | Status: AC
  Administered 2015-12-07: 50 mg via ORAL
  Filled 2015-12-07: qty 1

## 2015-12-07 NOTE — ED Triage Notes (Signed)
Pt with R ankle pain. Pt has a chronic injury to that ankle but states the pain has gotten worse lately and he has to limp on it.

## 2015-12-10 NOTE — ED Provider Notes (Signed)
[facility] Provider Note   CSN: [id number] Arrival date & time: 12/07/15  2038     History   Chief Complaint Chief Complaint  Patient presents with  . Ankle Pain    HPI Sean Dudley is a 40 y.o. male presenting with acute on chronic right ankle pain. He reports several injuries to the right ankle when in high school and his early 20's, describing sprain like injuries from playing sports which he never had evaluated medically. He has had increasing pain and intermittent swelling over the past months since he is employed in a job requiring prolonged standing. He describes worse pain when he first gets up and finds it extremely painful to weight bear after he had driven himself home from his work shift.  He notices swelling at the end of his work day which improves overnight. He has taken advil which sometimes offers relief.    Ankle Pain   Pertinent negatives include no numbness.    History reviewed. No pertinent past medical history.  There are no active problems to display for this patient.   History reviewed. No pertinent surgical history.     Home Medications    Prior to Admission medications   Medication Sig Start Date End Date Taking? Authorizing Provider  ibuprofen (ADVIL,MOTRIN) 200 MG tablet Take 200-400 mg by mouth every 6 (six) hours as needed for moderate pain.    Yes Historical Provider, MD  traMADol (ULTRAM) 50 MG tablet Take 1 tablet (50 mg total) by mouth every 6 (six) hours as needed. 12/07/15   Burgess Amor, PA-C    Family History Family History  Problem Relation Age of Onset  . Hypertension Father   . Diabetes Other   . Hypertension Other     Social History Social History  Substance Use Topics  . Smoking status: Current Every Day Smoker    Types: Cigarettes  . Smokeless tobacco: Never Used  . Alcohol use Yes     Comment: occasionally     Allergies   Review of patient's allergies indicates no known allergies.   Review of  Systems Review of Systems  Constitutional: Negative for fever.  Musculoskeletal: Positive for arthralgias and joint swelling. Negative for myalgias.  Neurological: Negative for weakness and numbness.     Physical Exam Updated Vital Signs BP 123/74 (BP Location: Left Arm)   Pulse 66   Temp 98 F (36.7 C) (Oral)   Resp 20   Ht 5\' 8"  (1.727 m)   Wt 83.9 kg   SpO2 100%   BMI 28.13 kg/m   Physical Exam  Constitutional: He appears well-developed and well-nourished.  HENT:  Head: Normocephalic.  Cardiovascular: Normal rate and intact distal pulses.  Exam reveals no decreased pulses.   Pulses:      Dorsalis pedis pulses are 2+ on the right side, and 2+ on the left side.       Posterior tibial pulses are 2+ on the right side, and 2+ on the left side.  Musculoskeletal: He exhibits edema and tenderness.       Right ankle: He exhibits swelling. He exhibits normal range of motion, no ecchymosis, no deformity and normal pulse. Tenderness. No lateral malleolus, no head of 5th metatarsal and no proximal fibula tenderness found. Achilles tendon normal.  ttp with edema noted anterior ankle. Dorsalis pedal pulses equal bilaterally.  Distal sensation intact with less than 2 sec cap refill in toes.   Neurological: He is alert. No sensory deficit.  Skin: Skin  is warm, dry and intact.  Nursing note and vitals reviewed.    ED Treatments / Results  Labs (all labs ordered are listed, but only abnormal results are displayed) Labs Reviewed - No data to display  EKG  EKG Interpretation None       Radiology   Dg Ankle Complete Right  Result Date: 12/07/2015 CLINICAL DATA:  Chronic ankle pain EXAM: RIGHT ANKLE - COMPLETE 3+ VIEW COMPARISON:  None. FINDINGS: No fracture or dislocation is seen. Mild degenerative changes involving the tibiotalar joint. Mild degenerative spurring along the medial malleolus. The ankle mortise is intact. The base of the fifth metatarsal is unremarkable. Visualized  soft tissues are within normal limits. IMPRESSION: No fracture or dislocation is seen. Mild degenerative changes involving the tibiotalar joint. Electronically Signed   By: Charline BillsSriyesh  Krishnan M.D.   On: 12/07/2015 21:53   Dg Foot Complete Right  Result Date: 12/07/2015 CLINICAL DATA:  Chronic ankle pain EXAM: RIGHT FOOT COMPLETE - 3+ VIEW COMPARISON:  None. FINDINGS: No fracture or dislocation is seen. Mild degenerative changes along the calcaneocuboid articulation. The visualized soft tissues are unremarkable. IMPRESSION: No fracture or dislocation is seen. Electronically Signed   By: Charline BillsSriyesh  Krishnan M.D.   On: 12/07/2015 21:50     Procedures Procedures (including critical care time)  Medications Ordered in ED Medications  traMADol (ULTRAM) tablet 50 mg (50 mg Oral Given 12/07/15 2247)     Initial Impression / Assessment and Plan / ED Course  I have reviewed the triage vital signs and the nursing notes.  Pertinent labs & imaging results that were available during my care of the patient were reviewed by me and considered in my medical decision making (see chart for details).  Clinical Course    Pt with chronic pain and osteoarthritic changes right foot and ankle.  He was encouraged he may continue taking ibuprofen, may want to consider arthritis strength tylenol.  Tramadol prescribed for increased pain. Cautioned re sedation. Referral to ortho for further eval/tx.   Final Clinical Impressions(s) / ED Diagnoses   Final diagnoses:  Foot pain, right  Post-traumatic osteoarthritis of right foot    New Prescriptions Discharge Medication List as of 12/07/2015 10:43 PM    START taking these medications   Details  traMADol (ULTRAM) 50 MG tablet Take 1 tablet (50 mg total) by mouth every 6 (six) hours as needed., Starting Thu 12/07/2015, Print         Burgess AmorJulie Ernestina Joe, PA-C 12/10/15 2150    Bethann BerkshireJoseph Zammit, MD 12/16/15 [phone]

## 2016-03-13 ENCOUNTER — Emergency Department (HOSPITAL_COMMUNITY)
Admission: EM | Admit: 2016-03-13 | Discharge: 2016-03-13 | Disposition: A | Discharge status: Home / Self Care | Payer: Managed Care, Other (non HMO) | Attending: Emergency Medicine | Admitting: Emergency Medicine

## 2016-03-13 ENCOUNTER — Emergency Department (HOSPITAL_COMMUNITY): Payer: Managed Care, Other (non HMO)

## 2016-03-13 ENCOUNTER — Encounter (HOSPITAL_COMMUNITY): Payer: Self-pay | Admitting: *Deleted

## 2016-03-13 DIAGNOSIS — Y990 Civilian activity done for income or pay: Secondary | ICD-10-CM | POA: Diagnosis not present

## 2016-03-13 DIAGNOSIS — S99911A Unspecified injury of right ankle, initial encounter: Secondary | ICD-10-CM | POA: Diagnosis present

## 2016-03-13 DIAGNOSIS — X500XXA Overexertion from strenuous movement or load, initial encounter: Secondary | ICD-10-CM | POA: Insufficient documentation

## 2016-03-13 DIAGNOSIS — S93401A Sprain of unspecified ligament of right ankle, initial encounter: Secondary | ICD-10-CM | POA: Diagnosis not present

## 2016-03-13 DIAGNOSIS — Y929 Unspecified place or not applicable: Secondary | ICD-10-CM | POA: Diagnosis not present

## 2016-03-13 DIAGNOSIS — F17210 Nicotine dependence, cigarettes, uncomplicated: Secondary | ICD-10-CM | POA: Insufficient documentation

## 2016-03-13 DIAGNOSIS — Y9389 Activity, other specified: Secondary | ICD-10-CM | POA: Insufficient documentation

## 2016-03-13 MED ORDER — NAPROXEN 250 MG PO TABS
500.0000 mg | ORAL_TABLET | Freq: Once | ORAL | Status: AC
  Administered 2016-03-13: 500 mg via ORAL
  Filled 2016-03-13: qty 2

## 2016-03-13 MED ORDER — NAPROXEN 500 MG PO TABS: 500.0000 mg | ORAL_TABLET | Freq: Two times a day (BID) | ORAL | 0 refills | Status: DC

## 2016-03-13 NOTE — ED Provider Notes (Signed)
[facility] Provider Note   CSN: [id number] Arrival date & time: 03/13/16  1714     History   Chief Complaint Chief Complaint  Patient presents with  . Ankle Pain    HPI Sean Dudley is a 40 y.o. male presenting with right ankle pain which occurred suddenly when the patient stepped through the top of the pallet at work when the top board broke, causing extreme plantar flexion of the right ankle joint.  Pain is aching, constant and worse with palpation, movement and weight bearing.  The patient was able to weight bear immediately after the event.  The injury occurred yesterday.  There is no radiation of pain and the patient denies numbness distal to the injury site.  The patients treatment prior to arrival included ice, rest and Tylenol. .  The history is provided by the patient.    History reviewed. No pertinent past medical history.  There are no active problems to display for this patient.   History reviewed. No pertinent surgical history.     Home Medications    Prior to Admission medications   Medication Sig Start Date End Date Taking? Authorizing Provider  ibuprofen (ADVIL,MOTRIN) 200 MG tablet Take 200-400 mg by mouth every 6 (six) hours as needed for moderate pain.     Historical Provider, MD  naproxen (NAPROSYN) 500 MG tablet Take 1 tablet (500 mg total) by mouth 2 (two) times daily. 03/13/16   Burgess AmorJulie Dresden Lozito, PA-C  traMADol (ULTRAM) 50 MG tablet Take 1 tablet (50 mg total) by mouth every 6 (six) hours as needed. 12/07/15   Burgess AmorJulie Sherill Mangen, PA-C    Family History Family History  Problem Relation Age of Onset  . Hypertension Father   . Diabetes Other   . Hypertension Other     Social History Social History  Substance Use Topics  . Smoking status: Current Every Day Smoker    Types: Cigarettes  . Smokeless tobacco: Never Used  . Alcohol use Yes     Comment: occasionally     Allergies   Patient has no known allergies.   Review of Systems Review of  Systems  Musculoskeletal: Positive for arthralgias and joint swelling.  Skin: Negative for wound.  Neurological: Negative for weakness and numbness.     Physical Exam Updated Vital Signs BP 124/75 (BP Location: Left Arm)   Pulse 84   Temp 98.6 F (37 C) (Oral)   Resp 18   Ht 5\' 8"  (1.727 m)   Wt 83.9 kg   SpO2 96%   BMI 28.13 kg/m   Physical Exam  Constitutional: He appears well-developed and well-nourished.  HENT:  Head: Normocephalic.  Cardiovascular: Normal rate and intact distal pulses.  Exam reveals no decreased pulses.   Pulses:      Dorsalis pedis pulses are 2+ on the right side, and 2+ on the left side.       Posterior tibial pulses are 2+ on the right side, and 2+ on the left side.  Musculoskeletal: He exhibits edema and tenderness.       Right ankle: He exhibits decreased range of motion, swelling and ecchymosis. He exhibits normal pulse. Tenderness. Medial malleolus tenderness found. No head of 5th metatarsal and no proximal fibula tenderness found. Achilles tendon normal.  Neurological: He is alert. No sensory deficit.  Skin: Skin is warm, dry and intact.  Nursing note and vitals reviewed.    ED Treatments / Results  Labs (all labs ordered are listed, but only abnormal  results are displayed) Labs Reviewed - No data to display  EKG  EKG Interpretation None       Radiology Dg Ankle Complete Right  Result Date: 03/13/2016 CLINICAL DATA:  RIGHT foot went through pallet at work today, resulting in hyperflexion. Ankle pain. EXAM: RIGHT ANKLE - COMPLETE 3+ VIEW COMPARISON:  RIGHT ankle radiograph December 07, 2015 FINDINGS: No acute fracture deformity or dislocation. Joint space intact without erosions. Similar mild heterotopic ossification along medial malleolus. Similar moderate calcaneal cuboid osteoarthrosis and moderate ankle degenerative change. No destructive bony lesions. Soft tissue planes are not suspicious. IMPRESSION: Stable degenerative change  without acute fracture deformity or dislocation. Electronically Signed   By: Awilda Metroourtnay  Bloomer M.D.   On: 03/13/2016 18:53    Procedures Procedures (including critical care time)  Medications Ordered in ED Medications  naproxen (NAPROSYN) tablet 500 mg (500 mg Oral Given 03/13/16 1837)     Initial Impression / Assessment and Plan / ED Course  I have reviewed the triage vital signs and the nursing notes.  Pertinent labs & imaging results that were available during my care of the patient were reviewed by me and considered in my medical decision making (see chart for details).  Clinical Course     ASO and crutches provided.  Cap refill normal after ASO applied.  RICE, referral to ortho if pain symptoms and swelling are not better over the next 7-10 days.      Final Clinical Impressions(s) / ED Diagnoses   Final diagnoses:  Sprain of right ankle, unspecified ligament, initial encounter    New Prescriptions New Prescriptions   NAPROXEN (NAPROSYN) 500 MG TABLET    Take 1 tablet (500 mg total) by mouth 2 (two) times daily.     Burgess AmorJulie Camella Seim, PA-C 03/13/16 1905    Raeford RazorStephen Kohut, MD 03/19/16 1007

## 2016-03-13 NOTE — ED Triage Notes (Signed)
Pt reports right ankle/foot pain after stepping wrong on a pallet last night. Pt ambulatory in triage, but limping.

## 2016-03-13 NOTE — ED Notes (Signed)
ED Provider at bedside. 

## 2016-03-13 NOTE — Discharge Instructions (Signed)
Wear the ASO and use crutches to avoid weight bearing.  Use ice and elevation as much as possible for the next several days to help reduce the swelling.  Take the medications prescribed.  You may take the naproxen prescribed for pain relief.  This will make you drowsy - do not drive within 4 hours of taking this medication.   Call the orthopedic doctor listed for a recheck of your injury if not improving over the next 7-10 days..  You may benefit from physical therapy of your ankle if it is not improving.

## 2016-05-18 ENCOUNTER — Emergency Department (HOSPITAL_COMMUNITY)
Admission: EM | Admit: 2016-05-18 | Discharge: 2016-05-18 | Disposition: A | Discharge status: Home / Self Care | Payer: Managed Care, Other (non HMO) | Attending: Emergency Medicine | Admitting: Emergency Medicine

## 2016-05-18 ENCOUNTER — Encounter (HOSPITAL_COMMUNITY): Payer: Self-pay | Admitting: *Deleted

## 2016-05-18 DIAGNOSIS — Z23 Encounter for immunization: Secondary | ICD-10-CM | POA: Insufficient documentation

## 2016-05-18 DIAGNOSIS — Y929 Unspecified place or not applicable: Secondary | ICD-10-CM | POA: Insufficient documentation

## 2016-05-18 DIAGNOSIS — Y939 Activity, unspecified: Secondary | ICD-10-CM | POA: Diagnosis not present

## 2016-05-18 DIAGNOSIS — F17210 Nicotine dependence, cigarettes, uncomplicated: Secondary | ICD-10-CM | POA: Insufficient documentation

## 2016-05-18 DIAGNOSIS — Z791 Long term (current) use of non-steroidal anti-inflammatories (NSAID): Secondary | ICD-10-CM | POA: Diagnosis not present

## 2016-05-18 DIAGNOSIS — S61011A Laceration without foreign body of right thumb without damage to nail, initial encounter: Secondary | ICD-10-CM | POA: Insufficient documentation

## 2016-05-18 DIAGNOSIS — W268XXA Contact with other sharp object(s), not elsewhere classified, initial encounter: Secondary | ICD-10-CM | POA: Insufficient documentation

## 2016-05-18 DIAGNOSIS — Y999 Unspecified external cause status: Secondary | ICD-10-CM | POA: Insufficient documentation

## 2016-05-18 MED ORDER — TETANUS-DIPHTH-ACELL PERTUSSIS 5-2.5-18.5 LF-MCG/0.5 IM SUSP
0.5000 mL | Freq: Once | INTRAMUSCULAR | Status: AC
  Administered 2016-05-18: 0.5 mL via INTRAMUSCULAR
  Filled 2016-05-18: qty 0.5

## 2016-05-18 MED ORDER — LIDOCAINE HCL (PF) 2 % IJ SOLN
10.0000 mL | Freq: Once | INTRAMUSCULAR | Status: AC
  Administered 2016-05-18: 10 mL
  Filled 2016-05-18: qty 10

## 2016-05-18 NOTE — Discharge Instructions (Signed)
Return to the ER, go to your doctor or go to urgent care in 10 days for suture removal

## 2016-05-18 NOTE — ED Triage Notes (Signed)
Pt hit hand on a porcelain figurine & cut thumb on the right hand.

## 2016-05-18 NOTE — ED Provider Notes (Signed)
[facility] Provider Note   CSN: [id number] Arrival date & time: 05/18/16  0400     History   Chief Complaint Chief Complaint  Patient presents with  . Laceration    HPI Sean Dudley is a 41 y.o. male.  Reports that he cut his right thumb on a broken piece of porcelain prior to arrival. He has minimal pain. No numbness or tingling. Bleeding controlled.      History reviewed. No pertinent past medical history.  There are no active problems to display for this patient.   History reviewed. No pertinent surgical history.     Home Medications    Prior to Admission medications   Medication Sig Start Date End Date Taking? Authorizing Provider  ibuprofen (ADVIL,MOTRIN) 200 MG tablet Take 200-400 mg by mouth every 6 (six) hours as needed for moderate pain.    Yes Historical Provider, MD  naproxen (NAPROSYN) 500 MG tablet Take 1 tablet (500 mg total) by mouth 2 (two) times daily. 03/13/16   Burgess AmorJulie Idol, PA-C  traMADol (ULTRAM) 50 MG tablet Take 1 tablet (50 mg total) by mouth every 6 (six) hours as needed. 12/07/15   Burgess AmorJulie Idol, PA-C    Family History Family History  Problem Relation Age of Onset  . Hypertension Father   . Diabetes Other   . Hypertension Other     Social History Social History  Substance Use Topics  . Smoking status: Current Every Day Smoker    Types: Cigarettes  . Smokeless tobacco: Never Used  . Alcohol use Yes     Comment: occasionally     Allergies   Patient has no known allergies.   Review of Systems Review of Systems  Skin: Positive for wound.     Physical Exam Updated Vital Signs BP 122/90 (BP Location: Left Arm)   Pulse 82   Temp 98.6 F (37 C) (Oral)   Resp 16   Ht 5\' 8"  (1.727 m)   Wt 185 lb (83.9 kg)   SpO2 100%   BMI 28.13 kg/m   Physical Exam  Musculoskeletal:       Right hand: He exhibits laceration (Dorsal aspect right thumb over the proximal phalanx). He exhibits normal range of motion, normal  two-point discrimination, normal capillary refill and no deformity.  Neurological: He has normal strength. No sensory deficit.  Skin: Laceration (Dorsal aspect of proximal thumb) noted.     ED Treatments / Results  Labs (all labs ordered are listed, but only abnormal results are displayed) Labs Reviewed - No data to display  EKG  EKG Interpretation None       Radiology No results found.  Procedures Procedures (including critical care time)  Medications Ordered in ED Medications  lidocaine (XYLOCAINE) 2 % injection 10 mL (not administered)  Tdap (BOOSTRIX) injection 0.5 mL (not administered)    LACERATION REPAIR Performed by: Gilda CreasePOLLINA, CHRISTOPHER J. Authorized by: Gilda CreasePOLLINA, CHRISTOPHER J. Consent: Verbal consent obtained. Risks and benefits: risks, benefits and alternatives were discussed Consent given by: patient Patient identity confirmed: provided demographic data Prepped and Draped in normal sterile fashion Wound explored  Laceration Location: thumb  Laceration Length: 1.5cm  No Foreign Bodies seen or palpated  Anesthesia: local infiltration  Local anesthetic: lidocaine 2% w/o epinephrine  Anesthetic total: 1 ml  Irrigation method: syringe Amount of cleaning: standard  Skin closure: sutures  Number of sutures: 3, 4-0 prolene  Technique: simple interrupted  Patient tolerance: Patient tolerated the procedure well with no immediate complications.  Initial Impression / Assessment and Plan / ED Course  I have reviewed the triage vital signs and the nursing notes.  Pertinent labs & imaging results that were available during my care of the patient were reviewed by me and considered in my medical decision making (see chart for details).     Patient presents with laceration of her thumb. Patient has normal neurovascular function. No evidence of tendon injury. Sutures place, will have sutures removed in 10 days.  Final Clinical Impressions(s) / ED  Diagnoses   Final diagnoses:  Laceration of right thumb without foreign body without damage to nail, initial encounter    New Prescriptions New Prescriptions   No medications on file     Gilda Crease, MD 05/18/16 [phone]

## 2016-05-31 ENCOUNTER — Emergency Department (HOSPITAL_COMMUNITY)
Admission: EM | Admit: 2016-05-31 | Discharge: 2016-05-31 | Disposition: A | Discharge status: Home / Self Care | Payer: Managed Care, Other (non HMO) | Attending: Emergency Medicine | Admitting: Emergency Medicine

## 2016-05-31 ENCOUNTER — Encounter (HOSPITAL_COMMUNITY): Payer: Self-pay | Admitting: Emergency Medicine

## 2016-05-31 DIAGNOSIS — Z4802 Encounter for removal of sutures: Secondary | ICD-10-CM | POA: Insufficient documentation

## 2016-05-31 DIAGNOSIS — Z79899 Other long term (current) drug therapy: Secondary | ICD-10-CM | POA: Diagnosis not present

## 2016-05-31 DIAGNOSIS — F17210 Nicotine dependence, cigarettes, uncomplicated: Secondary | ICD-10-CM | POA: Insufficient documentation

## 2016-05-31 NOTE — ED Provider Notes (Signed)
[facility] Provider Note   CSN: [id number] Arrival date & time: 05/31/16  4098     History   Chief Complaint Chief Complaint  Patient presents with  . Suture / Staple Removal    HPI Sean Dudley is a 41 y.o. male.  HPI   Sean Dudley is a 41 y.o. male who presents to the Emergency Department Requesting suture removal from the left thumb. He states that he was seen here on 05/18/2016 for laceration to his dorsal thumb. He states he has been cleaning the wound with soap and water and apply Neosporin daily. He denies any complications or symptoms at this time.   History reviewed. No pertinent past medical history.  There are no active problems to display for this patient.   History reviewed. No pertinent surgical history.     Home Medications    Prior to Admission medications   Medication Sig Start Date End Date Taking? Authorizing Provider  ibuprofen (ADVIL,MOTRIN) 200 MG tablet Take 200-400 mg by mouth every 6 (six) hours as needed for moderate pain.     Historical Provider, MD  naproxen (NAPROSYN) 500 MG tablet Take 1 tablet (500 mg total) by mouth 2 (two) times daily. 03/13/16   Burgess Amor, PA-C  traMADol (ULTRAM) 50 MG tablet Take 1 tablet (50 mg total) by mouth every 6 (six) hours as needed. 12/07/15   Burgess Amor, PA-C    Family History Family History  Problem Relation Age of Onset  . Hypertension Father   . Diabetes Other   . Hypertension Other     Social History Social History  Substance Use Topics  . Smoking status: Current Every Day Smoker    Types: Cigarettes  . Smokeless tobacco: Never Used  . Alcohol use Yes     Comment: occasionally     Allergies   Patient has no known allergies.   Review of Systems Review of Systems  Constitutional: Negative for chills and fever.  Musculoskeletal: Negative for arthralgias, back pain and joint swelling.  Skin: Positive for wound.       Laceration left thumb with sutures in place    Neurological: Negative for dizziness, weakness and numbness.  Hematological: Does not bruise/bleed easily.  All other systems reviewed and are negative.    Physical Exam Updated Vital Signs BP 125/80 (BP Location: Left Arm)   Pulse 93   Temp 98.4 F (36.9 C) (Oral)   Resp 16   Ht 5\' 8"  (1.727 m)   Wt 83.9 kg   SpO2 99%   BMI 28.13 kg/m   Physical Exam  Constitutional: He is oriented to person, place, and time. He appears well-developed and well-nourished. No distress.  HENT:  Head: Normocephalic and atraumatic.  Cardiovascular: Normal rate, regular rhythm and intact distal pulses.   Pulmonary/Chest: Effort normal and breath sounds normal. No respiratory distress.  Musculoskeletal: Normal range of motion. He exhibits no edema or tenderness.  Full ROM of the left thumb.  Sensation intact  Neurological: He is alert and oriented to person, place, and time. He exhibits normal muscle tone. Coordination normal.  Skin: Skin is warm. Laceration noted.  Laceration to the dorsal left thumb with three sutures in place, no erythema, edema or drainage.    Nursing note and vitals reviewed.    ED Treatments / Results  Labs (all labs ordered are listed, but only abnormal results are displayed) Labs Reviewed - No data to display  EKG  EKG Interpretation None  Radiology No results found.  Procedures Procedures (including critical care time)  Medications Ordered in ED Medications - No data to display   Initial Impression / Assessment and Plan / ED Course  I have reviewed the triage vital signs and the nursing notes.  Pertinent labs & imaging results that were available during my care of the patient were reviewed by me and considered in my medical decision making (see chart for details).    Pt well appearing.  Laceration to the dorsal right thumb.  3 sutures removed by me w/o difficulty.  Suture line intact.  No signs of infection.  NV intact.    Final Clinical  Impressions(s) / ED Diagnoses   Final diagnoses:  Visit for suture removal    New Prescriptions New Prescriptions   No medications on file     Rosey Bathammy Dua Mehler, PA-C 05/31/16 0934    Bethann BerkshireJoseph Zammit, MD 06/03/16 [phone]

## 2016-05-31 NOTE — ED Triage Notes (Signed)
Pt here to have stitches removed from left thumb, no signs of infection or drainage.  Pt was supposed to have them removed Monday, but has not gotten the chance until today.

## 2016-05-31 NOTE — Discharge Instructions (Signed)
Continue to clean with mild soap and water.  Keep it bandaged as needed.

## 2016-05-31 NOTE — ED Notes (Signed)
Pt made aware to return if symptoms worsen or if any life threatening symptoms occur.   

## 2016-06-07 ENCOUNTER — Emergency Department (HOSPITAL_COMMUNITY)
Admission: EM | Admit: 2016-06-07 | Discharge: 2016-06-07 | Disposition: A | Discharge status: Home / Self Care | Payer: Managed Care, Other (non HMO) | Attending: Emergency Medicine | Admitting: Emergency Medicine

## 2016-06-07 ENCOUNTER — Encounter (HOSPITAL_COMMUNITY): Payer: Self-pay | Admitting: Emergency Medicine

## 2016-06-07 DIAGNOSIS — M79644 Pain in right finger(s): Secondary | ICD-10-CM

## 2016-06-07 DIAGNOSIS — F17210 Nicotine dependence, cigarettes, uncomplicated: Secondary | ICD-10-CM | POA: Insufficient documentation

## 2016-06-07 DIAGNOSIS — Z79899 Other long term (current) drug therapy: Secondary | ICD-10-CM | POA: Diagnosis not present

## 2016-06-07 DIAGNOSIS — Z791 Long term (current) use of non-steroidal anti-inflammatories (NSAID): Secondary | ICD-10-CM | POA: Diagnosis not present

## 2016-06-07 NOTE — Discharge Instructions (Signed)
Minimal use of the right thumb for one week.  Ibuprofen 600-800 mg 3 times a day as needed.  Follow-up with the hand specialist listed if not improving

## 2016-06-07 NOTE — ED Provider Notes (Signed)
[facility] Provider Note   CSN: [id number] Arrival date & time: 06/07/16  1759     History   Chief Complaint Chief Complaint  Patient presents with  . Hand Pain    HPI Sean Dudley is a 41 y.o. male.  HPI   Sean Dudley is a 41 y.o. male who presents to the Emergency Department complaining of aching pain and "soreness" to his right thumb.  He was seen here on 05/18/16 and treated for a laceration to the dorsal right thumb and seen on 05/31/16 for suture removal.  He states that since the sutures were removed and he has started using the thumb again, he is having "soreness" to the thumb associated with movement.  He denies swelling, wrist pain, numbness or discoloration.   History reviewed. No pertinent past medical history.  There are no active problems to display for this patient.   History reviewed. No pertinent surgical history.     Home Medications    Prior to Admission medications   Medication Sig Start Date End Date Taking? Authorizing Provider  ibuprofen (ADVIL,MOTRIN) 200 MG tablet Take 200-400 mg by mouth every 6 (six) hours as needed for moderate pain.     Historical Provider, MD  naproxen (NAPROSYN) 500 MG tablet Take 1 tablet (500 mg total) by mouth 2 (two) times daily. 03/13/16   Burgess AmorJulie Idol, PA-C  traMADol (ULTRAM) 50 MG tablet Take 1 tablet (50 mg total) by mouth every 6 (six) hours as needed. 12/07/15   Burgess AmorJulie Idol, PA-C    Family History Family History  Problem Relation Age of Onset  . Hypertension Father   . Diabetes Other   . Hypertension Other     Social History Social History  Substance Use Topics  . Smoking status: Current Every Day Smoker    Packs/day: 0.50    Types: Cigarettes  . Smokeless tobacco: Never Used  . Alcohol use Yes     Comment: occasionally     Allergies   Patient has no known allergies.   Review of Systems Review of Systems  Constitutional: Negative for chills and fever.  Genitourinary: Negative for  difficulty urinating and dysuria.  Musculoskeletal: Positive for arthralgias (right thumb pain). Negative for joint swelling.  Skin: Negative for color change.  Neurological: Negative for weakness and numbness.  All other systems reviewed and are negative.    Physical Exam Updated Vital Signs BP 127/75 (BP Location: Left Arm)   Pulse 82   Temp 98.8 F (37.1 C) (Oral)   Resp 18   Ht 5\' 8"  (1.727 m)   Wt 83.9 kg   SpO2 99%   BMI 28.13 kg/m   Physical Exam  Constitutional: He is oriented to person, place, and time. He appears well-developed and well-nourished. No distress.  HENT:  Head: Normocephalic and atraumatic.  Cardiovascular: Normal rate, regular rhythm and intact distal pulses.   No murmur heard. Pulmonary/Chest: Effort normal and breath sounds normal. No respiratory distress.  Musculoskeletal: Normal range of motion. He exhibits tenderness. He exhibits no edema.  Pt has full ROM of the right thumb.  Pain reproduced with dorsiflexion of the thumb.  Appears to be well healed.  No edema, erythema.  Sensation intact.  CR< 2 sec  Neurological: He is alert and oriented to person, place, and time. He exhibits normal muscle tone. Coordination normal.  Skin: Skin is warm.  Nursing note and vitals reviewed.    ED Treatments / Results  Labs (all labs ordered are  listed, but only abnormal results are displayed) Labs Reviewed - No data to display  EKG  EKG Interpretation None       Radiology No results found.  Procedures Procedures (including critical care time)  Medications Ordered in ED Medications - No data to display   Initial Impression / Assessment and Plan / ED Course  I have reviewed the triage vital signs and the nursing notes.  Pertinent labs & imaging results that were available during my care of the patient were reviewed by me and considered in my medical decision making (see chart for details).     Pt with well healed lac to the dorsal thumb, NV  intact.  Discussed possible extensor tendon injury although he does not have decreased ROM of the thumb.  No concerning sx's for infectious process. He agrees to symptomatic tx with ice, NSAID.  Referral info given for hand specialist.`  Final Clinical Impressions(s) / ED Diagnoses   Final diagnoses:  Pain of right thumb    New Prescriptions Discharge Medication List as of 06/07/2016  7:11 PM       Tylea Hise Trisha Mangle, PA-C 06/08/16 2332    Samuel Jester, DO 06/09/16 [phone]

## 2016-06-07 NOTE — ED Triage Notes (Signed)
PT states he had stitches removed from his right thumb this past Saturday and without any new injury thumb has been aching x3 days.

## 2016-07-02 ENCOUNTER — Emergency Department (HOSPITAL_COMMUNITY)
Admission: EM | Admit: 2016-07-02 | Discharge: 2016-07-02 | Disposition: A | Discharge status: Home / Self Care | Payer: Managed Care, Other (non HMO) | Attending: Emergency Medicine | Admitting: Emergency Medicine

## 2016-07-02 ENCOUNTER — Encounter (HOSPITAL_COMMUNITY): Payer: Self-pay | Admitting: Emergency Medicine

## 2016-07-02 DIAGNOSIS — Z79899 Other long term (current) drug therapy: Secondary | ICD-10-CM | POA: Insufficient documentation

## 2016-07-02 DIAGNOSIS — Z791 Long term (current) use of non-steroidal anti-inflammatories (NSAID): Secondary | ICD-10-CM | POA: Diagnosis not present

## 2016-07-02 DIAGNOSIS — F17210 Nicotine dependence, cigarettes, uncomplicated: Secondary | ICD-10-CM | POA: Diagnosis not present

## 2016-07-02 DIAGNOSIS — R0981 Nasal congestion: Secondary | ICD-10-CM | POA: Diagnosis present

## 2016-07-02 DIAGNOSIS — J00 Acute nasopharyngitis [common cold]: Secondary | ICD-10-CM | POA: Insufficient documentation

## 2016-07-02 NOTE — Discharge Instructions (Signed)
May continue taking your DayQuil as discussed.  Adding ibuprofen 400 mg every 6 hours can also help with symptom relief.  Rest makes her drinking plenty of fluids.

## 2016-07-02 NOTE — ED Triage Notes (Signed)
Sore throat, sneezing,  Congestion.  Other family members in house are sick

## 2016-07-05 NOTE — ED Provider Notes (Signed)
[facility] Provider Note   CSN: [id number] Arrival date & time: 07/02/16  1910     History   Chief Complaint Chief Complaint  Patient presents with  . Sore Throat    HPI Sean Dudley is a 41 y.o. male presenting with a 3 day history of uri type symptoms which includes nasal congestion with clear rhinorrhea, sneezing, sore throat and nonproductive cough.  Symptoms do not include shortness of breath, chest pain,  Nausea, vomiting or diarrhea.  The patient has taken dayquill prior to arrival with transient significant improvement in symptoms. .  The history is provided by the patient.    History reviewed. No pertinent past medical history.  There are no active problems to display for this patient.   History reviewed. No pertinent surgical history.     Home Medications    Prior to Admission medications   Medication Sig Start Date End Date Taking? Authorizing Provider  ibuprofen (ADVIL,MOTRIN) 200 MG tablet Take 200-400 mg by mouth every 6 (six) hours as needed for moderate pain.     Historical Provider, MD  naproxen (NAPROSYN) 500 MG tablet Take 1 tablet (500 mg total) by mouth 2 (two) times daily. 03/13/16   Burgess AmorJulie Johm Pfannenstiel, PA-C  traMADol (ULTRAM) 50 MG tablet Take 1 tablet (50 mg total) by mouth every 6 (six) hours as needed. 12/07/15   Burgess AmorJulie Blen Ransome, PA-C    Family History Family History  Problem Relation Age of Onset  . Hypertension Father   . Diabetes Other   . Hypertension Other     Social History Social History  Substance Use Topics  . Smoking status: Current Every Day Smoker    Packs/day: 0.50    Types: Cigarettes  . Smokeless tobacco: Never Used  . Alcohol use Yes     Comment: occasionally     Allergies   Patient has no known allergies.   Review of Systems Review of Systems  Constitutional: Negative for chills and fever.  HENT: Positive for congestion, rhinorrhea, sneezing and sore throat. Negative for ear pain, sinus pressure, trouble  swallowing and voice change.   Eyes: Negative for discharge.  Respiratory: Positive for cough. Negative for shortness of breath, wheezing and stridor.   Cardiovascular: Negative for chest pain.  Gastrointestinal: Negative for abdominal pain.  Genitourinary: Negative.      Physical Exam Updated Vital Signs BP 129/68 (BP Location: Left Arm)   Pulse 83   Temp 99.3 F (37.4 C) (Temporal)   Resp 18   Ht 5\' 8"  (1.727 m)   Wt 83.9 kg   SpO2 100%   BMI 28.13 kg/m   Physical Exam  Constitutional: He is oriented to person, place, and time. He appears well-developed and well-nourished.  HENT:  Head: Normocephalic and atraumatic.  Right Ear: Tympanic membrane and ear canal normal.  Left Ear: Tympanic membrane and ear canal normal.  Nose: Mucosal edema and rhinorrhea present.  Mouth/Throat: Uvula is midline, oropharynx is clear and moist and mucous membranes are normal. No oropharyngeal exudate, posterior oropharyngeal edema, posterior oropharyngeal erythema or tonsillar abscesses.  Eyes: Conjunctivae are normal.  Cardiovascular: Normal rate and normal heart sounds.   Pulmonary/Chest: Effort normal. No respiratory distress. He has no wheezes. He has no rales.  Musculoskeletal: Normal range of motion.  Neurological: He is alert and oriented to person, place, and time.  Skin: Skin is warm and dry. No rash noted.  Psychiatric: He has a normal mood and affect.     ED Treatments /  Results  Labs (all labs ordered are listed, but only abnormal results are displayed) Labs Reviewed - No data to display  EKG  EKG Interpretation None       Radiology No results found.  Procedures Procedures (including critical care time)  Medications Ordered in ED Medications - No data to display   Initial Impression / Assessment and Plan / ED Course  I have reviewed the triage vital signs and the nursing notes.  Pertinent labs & imaging results that were available during my care of the  patient were reviewed by me and considered in my medical decision making (see chart for details).     Sx c/w viral uri. Other household members with similar sx. Advised may continue dayquill, add ibuprofen, rest, increased fluid intake, prn f/u for any worsened or persistent sx.  Final Clinical Impressions(s) / ED Diagnoses   Final diagnoses:  Acute nasopharyngitis    New Prescriptions Discharge Medication List as of 07/02/2016  9:47 PM       Burgess Amor, PA-C 07/05/16 1258    Donnetta Hutching, MD 07/06/16 [phone]

## 2016-11-23 ENCOUNTER — Emergency Department (HOSPITAL_COMMUNITY)
Admission: EM | Admit: 2016-11-23 | Discharge: 2016-11-24 | Disposition: A | Discharge status: Home / Self Care | Payer: Managed Care, Other (non HMO) | Attending: Emergency Medicine | Admitting: Emergency Medicine

## 2016-11-23 ENCOUNTER — Emergency Department (HOSPITAL_COMMUNITY): Payer: Managed Care, Other (non HMO)

## 2016-11-23 ENCOUNTER — Encounter (HOSPITAL_COMMUNITY): Payer: Self-pay | Admitting: Emergency Medicine

## 2016-11-23 DIAGNOSIS — Y9389 Activity, other specified: Secondary | ICD-10-CM | POA: Diagnosis not present

## 2016-11-23 DIAGNOSIS — Y9289 Other specified places as the place of occurrence of the external cause: Secondary | ICD-10-CM | POA: Insufficient documentation

## 2016-11-23 DIAGNOSIS — S29011A Strain of muscle and tendon of front wall of thorax, initial encounter: Secondary | ICD-10-CM | POA: Diagnosis not present

## 2016-11-23 DIAGNOSIS — Z79899 Other long term (current) drug therapy: Secondary | ICD-10-CM | POA: Diagnosis not present

## 2016-11-23 DIAGNOSIS — Y999 Unspecified external cause status: Secondary | ICD-10-CM | POA: Insufficient documentation

## 2016-11-23 DIAGNOSIS — F17210 Nicotine dependence, cigarettes, uncomplicated: Secondary | ICD-10-CM | POA: Diagnosis not present

## 2016-11-23 DIAGNOSIS — S299XXA Unspecified injury of thorax, initial encounter: Secondary | ICD-10-CM | POA: Diagnosis present

## 2016-11-23 DIAGNOSIS — X500XXA Overexertion from strenuous movement or load, initial encounter: Secondary | ICD-10-CM | POA: Diagnosis not present

## 2016-11-23 MED ORDER — CYCLOBENZAPRINE HCL 10 MG PO TABS
10.0000 mg | ORAL_TABLET | Freq: Once | ORAL | Status: AC
  Administered 2016-11-23: 10 mg via ORAL
  Filled 2016-11-23: qty 1

## 2016-11-23 MED ORDER — IBUPROFEN 800 MG PO TABS
800.0000 mg | ORAL_TABLET | Freq: Once | ORAL | Status: AC
  Administered 2016-11-23: 800 mg via ORAL
  Filled 2016-11-23: qty 1

## 2016-11-23 NOTE — ED Triage Notes (Signed)
Reports L rib pain since this am 0530 Point to axilla as pain site

## 2016-11-24 MED ORDER — CYCLOBENZAPRINE HCL 5 MG PO TABS: 5.0000 mg | ORAL_TABLET | Freq: Three times a day (TID) | ORAL | 0 refills | Status: DC | PRN

## 2016-11-24 MED ORDER — IBUPROFEN 800 MG PO TABS: 800.0000 mg | ORAL_TABLET | Freq: Three times a day (TID) | ORAL | 0 refills | Status: DC

## 2016-11-24 NOTE — Discharge Instructions (Signed)
Take the medicines prescribed as directed. In addition, you may find a heating pad applied to your area of pain helpful - 20 minutes several times daily.  Get rechecked for any new or worsened symptoms.  Your xray is normal tonight.

## 2016-11-25 NOTE — ED Provider Notes (Signed)
[facility] Provider Note   CSN: [id number] Arrival date & time: 11/23/16  2146     History   Chief Complaint Chief Complaint  Patient presents with  . Rib Injury    HPI Sean Dudley is a 41 y.o. 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  He describes heavy lifting and lots of overhead work and felt sore along his left ribcage prior to taking a nap this afternoon.  When he woke, his pain was worse, describes sharp pain with movement such as twisting his torso and raising his arm overhead.  He denies injury or falls and also denies sob, palpitations, n/v diaphoresis.  He is sx free at rest.  He has taken no medicines prior to arrival.  HPI  History reviewed. No pertinent past medical history.  There are no active problems to display for this patient.   History reviewed. No pertinent surgical history.     Home Medications    Prior to Admission medications   Medication Sig Start Date End Date Taking? Authorizing Provider  cyclobenzaprine (FLEXERIL) 5 MG tablet Take 1 tablet (5 mg total) by mouth 3 (three) times daily as needed for muscle spasms. 11/24/16   Burgess Amor, PA-C  ibuprofen (ADVIL,MOTRIN) 800 MG tablet Take 1 tablet (800 mg total) by mouth 3 (three) times daily. 11/24/16   Burgess Amor, PA-C  naproxen (NAPROSYN) 500 MG tablet Take 1 tablet (500 mg total) by mouth 2 (two) times daily. 03/13/16   Burgess Amor, PA-C  traMADol (ULTRAM) 50 MG tablet Take 1 tablet (50 mg total) by mouth every 6 (six) hours as needed. 12/07/15   Burgess Amor, PA-C    Family History Family History  Problem Relation Age of Onset  . Hypertension Father   . Diabetes Other   . Hypertension Other     Social History Social History  Substance Use Topics  . Smoking status: Current Every Day Smoker    Packs/day: 0.50    Types: Cigarettes  . Smokeless tobacco: Never Used  . Alcohol use Yes     Comment:  occasionally     Allergies   Patient has no known allergies.   Review of Systems Review of Systems  Constitutional: Negative for fever.  HENT: Negative.   Respiratory: Negative for cough and shortness of breath.   Cardiovascular: Positive for chest pain. Negative for palpitations.  Gastrointestinal: Negative for nausea and vomiting.  Musculoskeletal: Positive for arthralgias. Negative for joint swelling and myalgias.  Skin: Negative.   Neurological: Negative for weakness and numbness.     Physical Exam Updated Vital Signs BP 116/83 (BP Location: Right Arm)   Pulse 66   Temp 98.4 F (36.9 C) (Oral)   Resp 16   SpO2 100%   Physical Exam  Constitutional: He appears well-developed and well-nourished.  HENT:  Head: Atraumatic.  Neck: Normal range of motion.  Cardiovascular: Regular rhythm.   Pulses equal bilaterally  Pulmonary/Chest: Effort normal and breath sounds normal. He has no decreased breath sounds. He has no wheezes. He has no rhonchi.  Abdominal: Soft. Bowel sounds are normal. There is no tenderness.  Musculoskeletal: He exhibits tenderness.       Arms: ttp along left lateral chest wall from midaxillary line to left lateral posterior midback.    Neurological: He is alert. He has normal strength. He displays normal reflexes. No sensory deficit.  Skin: Skin is warm and dry.  Psychiatric: He has a normal mood and affect.     ED Treatments / Results  Labs (all labs ordered are listed, but only abnormal results are displayed) Labs Reviewed - No data to display  EKG  EKG Interpretation None       Radiology Dg Chest 2 View  Result Date: 11/24/2016 CLINICAL DATA:  Left-sided rib and axillary pain since 0530 hours. EXAM: CHEST  2 VIEW COMPARISON:  04/17/2015 FINDINGS: The heart size and mediastinal contours are within normal limits. Both lungs are clear. The visualized skeletal structures are unremarkable. IMPRESSION: No active cardiopulmonary disease.  Electronically Signed   By: Tollie Ethavid  Kwon M.D.   On: 11/24/2016 00:18    Procedures Procedures (including critical care time)  Medications Ordered in ED Medications  ibuprofen (ADVIL,MOTRIN) tablet 800 mg (800 mg Oral Given 11/23/16 2330)  cyclobenzaprine (FLEXERIL) tablet 10 mg (10 mg Oral Given 11/23/16 2330)     Initial Impression / Assessment and Plan / ED Course  I have reviewed the triage vital signs and the nursing notes.  Pertinent labs & imaging results that were available during my care of the patient were reviewed by me and considered in my medical decision making (see chart for details).     Pt with reproducible left chest and back pain.  He has no sob and is PERC negative, no risk factors for PE. Sx present only with twisting and left arm movement and exam c/w musculoskeletal source.  Flexeril, ibuprofen, heat tx.  Prn f/u for any persistent sx.  Referrals given to establish pcp.   Final Clinical Impressions(s) / ED Diagnoses   Final diagnoses:  Muscle strain of chest wall, initial encounter    New Prescriptions Discharge Medication List as of 11/24/2016 12:34 AM    START taking these medications   Details  cyclobenzaprine (FLEXERIL) 5 MG tablet Take 1 tablet (5 mg total) by mouth 3 (three) times daily as needed for muscle spasms., Starting Sun 11/24/2016, Print         Burgess AmorIdol, Maycee Blasco, PA-C 11/25/16 Everlean Patterson0221    Zammit, Joseph, MD 11/25/16 1335

## 2016-12-08 ENCOUNTER — Emergency Department (HOSPITAL_COMMUNITY)
Admission: EM | Admit: 2016-12-08 | Discharge: 2016-12-08 | Disposition: A | Discharge status: Home / Self Care | Payer: Managed Care, Other (non HMO) | Attending: Emergency Medicine | Admitting: Emergency Medicine

## 2016-12-08 ENCOUNTER — Encounter (HOSPITAL_COMMUNITY): Payer: Self-pay | Admitting: *Deleted

## 2016-12-08 ENCOUNTER — Emergency Department (HOSPITAL_COMMUNITY): Payer: Managed Care, Other (non HMO)

## 2016-12-08 DIAGNOSIS — Z79899 Other long term (current) drug therapy: Secondary | ICD-10-CM | POA: Diagnosis not present

## 2016-12-08 DIAGNOSIS — F17210 Nicotine dependence, cigarettes, uncomplicated: Secondary | ICD-10-CM | POA: Diagnosis not present

## 2016-12-08 DIAGNOSIS — M545 Low back pain, unspecified: Secondary | ICD-10-CM

## 2016-12-08 HISTORY — DX: Low back pain, unspecified: M54.50

## 2016-12-08 HISTORY — DX: Low back pain: M54.5

## 2016-12-08 LAB — URINALYSIS, ROUTINE W REFLEX MICROSCOPIC
BILIRUBIN URINE: NEGATIVE
GLUCOSE, UA: NEGATIVE mg/dL
KETONES UR: NEGATIVE mg/dL
NITRITE: NEGATIVE
PH: 6 (ref 5.0–8.0)
Protein, ur: 100 mg/dL — AB
Specific Gravity, Urine: 1.005 (ref 1.005–1.030)

## 2016-12-08 MED ORDER — METHOCARBAMOL 500 MG PO TABS: 1000.0000 mg | ORAL_TABLET | Freq: Four times a day (QID) | ORAL | 0 refills | Status: DC | PRN

## 2016-12-08 MED ORDER — NAPROXEN 250 MG PO TABS: 250.0000 mg | ORAL_TABLET | Freq: Two times a day (BID) | ORAL | 0 refills | Status: DC | PRN

## 2016-12-08 NOTE — ED Triage Notes (Signed)
Pt reports mid back back pain that radiates around to his sides. Pt reports increased urination and feeling "achy" Pt is going to need a work note for [employer]

## 2016-12-08 NOTE — ED Provider Notes (Signed)
[facility]-EMERGENCY DEPT Provider Note   CSN: [id number] Arrival date & time: 12/08/16  2032     History   Chief Complaint Chief Complaint  Patient presents with  . Back Pain    HPI Sean Dudley is a 41 y.o. male.  HPI  Pt was seen at 2100. Per pt, c/o gradual onset and persistence of constant low back "pain" that began this morning.  Pain worsens with palpation of the area and body position changes. Pt states he "does a lot of bending and lifting" at work.  Pt states he left work tonight to come to the ED for evaluation. Pt is requesting a work note. Denies incont/retention of bowel or bladder, no saddle anesthesia, no focal motor weakness, no tingling/numbness in extremities, no fevers, no injury, no abd pain, no N/V/D, no dysuria/hematuria, no testicular pain/swelling.   The symptoms have been associated with no other complaints. Endorses hx of similar symptoms and previous ED evaluation for same.   Past Medical History:  Diagnosis Date  . Low back pain     There are no active problems to display for this patient.   History reviewed. No pertinent surgical history.     Home Medications    Prior to Admission medications   Medication Sig Start Date End Date Taking? Authorizing Provider  cyclobenzaprine (FLEXERIL) 5 MG tablet Take 1 tablet (5 mg total) by mouth 3 (three) times daily as needed for muscle spasms. 11/24/16   Burgess Amor, PA-C  ibuprofen (ADVIL,MOTRIN) 800 MG tablet Take 1 tablet (800 mg total) by mouth 3 (three) times daily. 11/24/16   Burgess Amor, PA-C  naproxen (NAPROSYN) 500 MG tablet Take 1 tablet (500 mg total) by mouth 2 (two) times daily. 03/13/16   Burgess Amor, PA-C  traMADol (ULTRAM) 50 MG tablet Take 1 tablet (50 mg total) by mouth every 6 (six) hours as needed. 12/07/15   Burgess Amor, PA-C    Family History Family History  Problem Relation Age of Onset  . Hypertension Father   . Diabetes Other   . Hypertension Other     Social History Social  History  Substance Use Topics  . Smoking status: Current Every Day Smoker    Packs/day: 0.50    Types: Cigarettes  . Smokeless tobacco: Never Used  . Alcohol use Yes     Comment: occasionally     Allergies   Patient has no known allergies.   Review of Systems Review of Systems ROS: Statement: All systems negative except as marked or noted in the HPI; Constitutional: Negative for fever and chills. ; ; Eyes: Negative for eye pain, redness and discharge. ; ; ENMT: Negative for ear pain, hoarseness, nasal congestion, sinus pressure and sore throat. ; ; Cardiovascular: Negative for chest pain, palpitations, diaphoresis, dyspnea and peripheral edema. ; ; Respiratory: Negative for cough, wheezing and stridor. ; ; Gastrointestinal: Negative for nausea, vomiting, diarrhea, abdominal pain, blood in stool, hematemesis, jaundice and rectal bleeding. . ; ; Genitourinary: Negative for dysuria, flank pain and hematuria. ; ; Musculoskeletal: +LBP. Negative for neck pain. Negative for swelling and trauma.; ; Skin: Negative for pruritus, rash, abrasions, blisters, bruising and skin lesion.; ; Neuro: Negative for headache, lightheadedness and neck stiffness. Negative for weakness, altered level of consciousness, altered mental status, extremity weakness, paresthesias, involuntary movement, seizure and syncope.      Physical Exam Updated Vital Signs BP 115/85 (BP Location: Left Arm)   Pulse 80   Temp 98.7 F (37.1 C) (Oral)  Resp 16   Ht 5\' 8"  (1.727 m)   Wt 83.9 kg (185 lb)   SpO2 99%   BMI 28.13 kg/m   Physical Exam 2105: Physical examination:  Nursing notes reviewed; Vital signs and O2 SAT reviewed;  Constitutional: Well developed, Well nourished, Well hydrated, In no acute distress; Head:  Normocephalic, atraumatic; Eyes: EOMI, PERRL, No scleral icterus; ENMT: Mouth and pharynx normal, Mucous membranes moist; Neck: Supple, Full range of motion, No lymphadenopathy; Cardiovascular: Regular rate  and rhythm, No gallop; Respiratory: Breath sounds clear & equal bilaterally, No wheezes.  Speaking full sentences with ease, Normal respiratory effort/excursion; Chest: Nontender, Movement normal; Abdomen: Soft, Nontender, Nondistended, Normal bowel sounds; Genitourinary: No CVA tenderness; Spine:  No midline CS, TS, LS tenderness. +TTP L>R lumbar paraspinal muscles. No rash.;; Extremities: Pulses normal, No tenderness, No edema, No calf edema or asymmetry.; Neuro: AA&Ox3, Major CN grossly intact.  Speech clear. No gross focal motor or sensory deficits in extremities. Strength 5/5 equal bilat UE's and LE's, including great toe dorsiflexion.  DTR 2/4 equal bilat UE's and LE's.  No gross sensory deficits.  Neg straight leg raises bilat. Climbs on and off stretcher easily by himself. Gait steady.; Skin: Color normal, Warm, Dry.   ED Treatments / Results  Labs (all labs ordered are listed, but only abnormal results are displayed)   EKG  EKG Interpretation None       Radiology   Procedures Procedures (including critical care time)  Medications Ordered in ED Medications - No data to display   Initial Impression / Assessment and Plan / ED Course  I have reviewed the triage vital signs and the nursing notes.  Pertinent labs & imaging results that were available during my care of the patient were reviewed by me and considered in my medical decision making (see chart for details).  MDM Reviewed: previous chart, nursing note and vitals Interpretation: labs and x-ray   Results for orders placed or performed during the hospital encounter of 12/08/16  Urinalysis, Routine w reflex microscopic  Result Value Ref Range   Color, Urine STRAW (A) YELLOW   APPearance CLEAR CLEAR   Specific Gravity, Urine 1.005 1.005 - 1.030   pH 6.0 5.0 - 8.0   Glucose, UA NEGATIVE NEGATIVE mg/dL   Hgb urine dipstick SMALL (A) NEGATIVE   Bilirubin Urine NEGATIVE NEGATIVE   Ketones, ur NEGATIVE NEGATIVE mg/dL     Protein, ur 161 (A) NEGATIVE mg/dL   Nitrite NEGATIVE NEGATIVE   Leukocytes, UA TRACE (A) NEGATIVE   RBC / HPF 6-30 0 - 5 RBC/hpf   WBC, UA 6-30 0 - 5 WBC/hpf   Bacteria, UA RARE (A) NONE SEEN   Squamous Epithelial / LPF 0-5 (A) NONE SEEN   Mucus PRESENT     Dg Lumbar Spine Complete Result Date: 12/08/2016 CLINICAL DATA:  Mid back pain EXAM: LUMBAR SPINE - COMPLETE 4+ VIEW COMPARISON:  None. FINDINGS: There is no evidence of lumbar spine fracture. Alignment is normal. Minimal degenerative changes at L1-L2 and L2-L3. IMPRESSION: Minimal degenerative changes.  No acute osseous abnormality. Electronically Signed   By: Jasmine Pang M.D.   On: 12/08/2016 22:40    2305:  No clear UTI on Udip and pt denies dysuria; UC pending. Tx symptomatically at this time. Dx and testing d/w pt.  Questions answered.  Verb understanding, agreeable to d/c home with outpt f/u.    Final Clinical Impressions(s) / ED Diagnoses   Final diagnoses:  None    New  Prescriptions New Prescriptions   No medications on file     Samuel Jester, DO 12/12/16 1610

## 2016-12-08 NOTE — Discharge Instructions (Signed)
Take the prescriptions as directed.  Apply moist heat or ice to the area(s) of discomfort, for 15 minutes at a time, several times per day for the next few days.  Do not fall asleep on a heating or ice pack.  Call your regular medical doctor on Monday to schedule a follow up appointment this week.  Return to the Emergency Department immediately if worsening. ° °

## 2016-12-10 LAB — URINE CULTURE: Culture: NO GROWTH

## 2018-04-18 IMAGING — DX DG LUMBAR SPINE COMPLETE 4+V
5 series · 5 of 5 positions shown · non-contrast
Comparison: None.

CLINICAL DATA: Mid back pain

EXAM:
LUMBAR SPINE - COMPLETE 4+ VIEW

[l-spine ap]
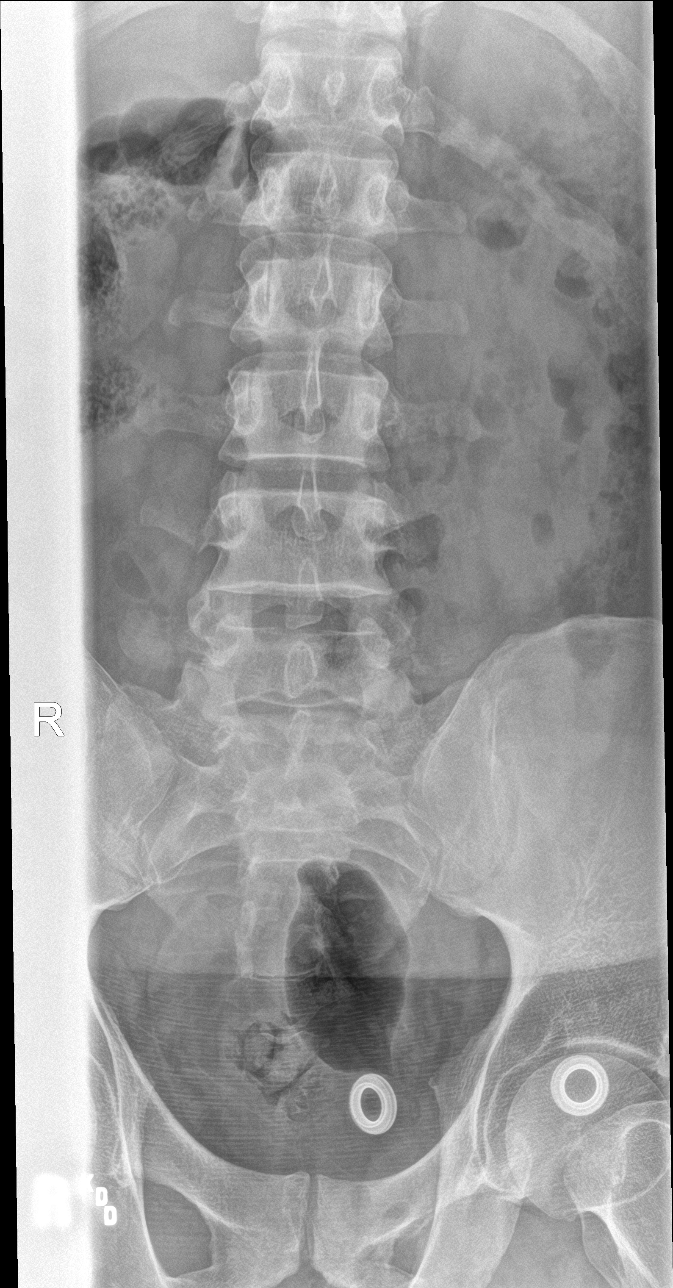

[l-spine obl (1 of 2)]
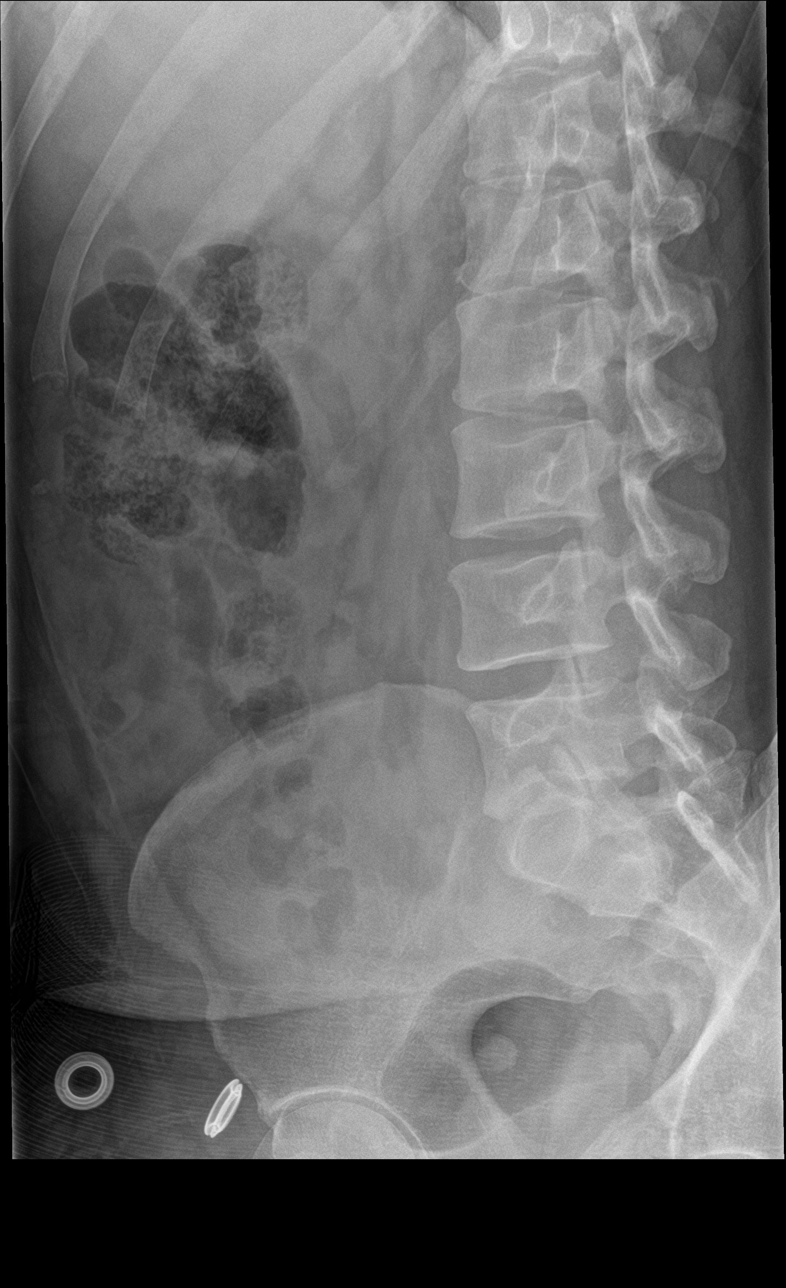

[l-spine obl (2 of 2)]
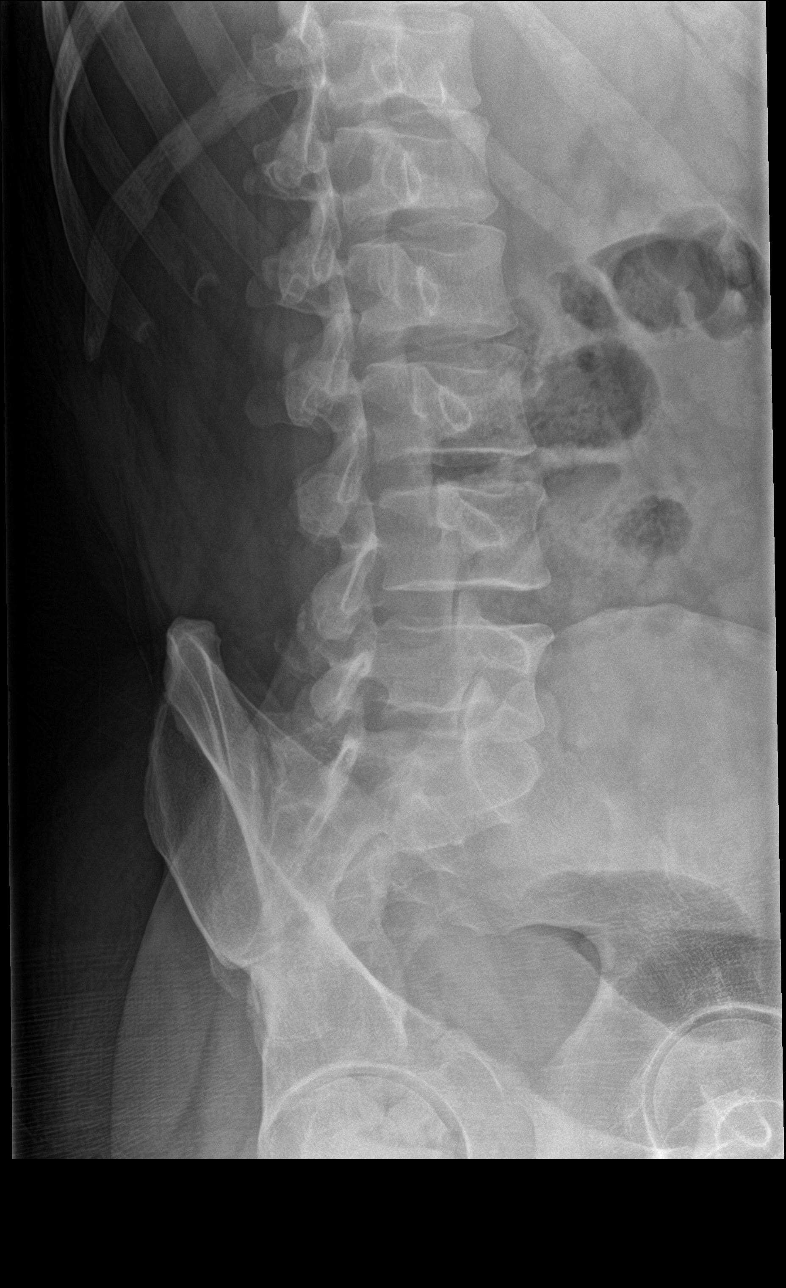

[l-spine lat]
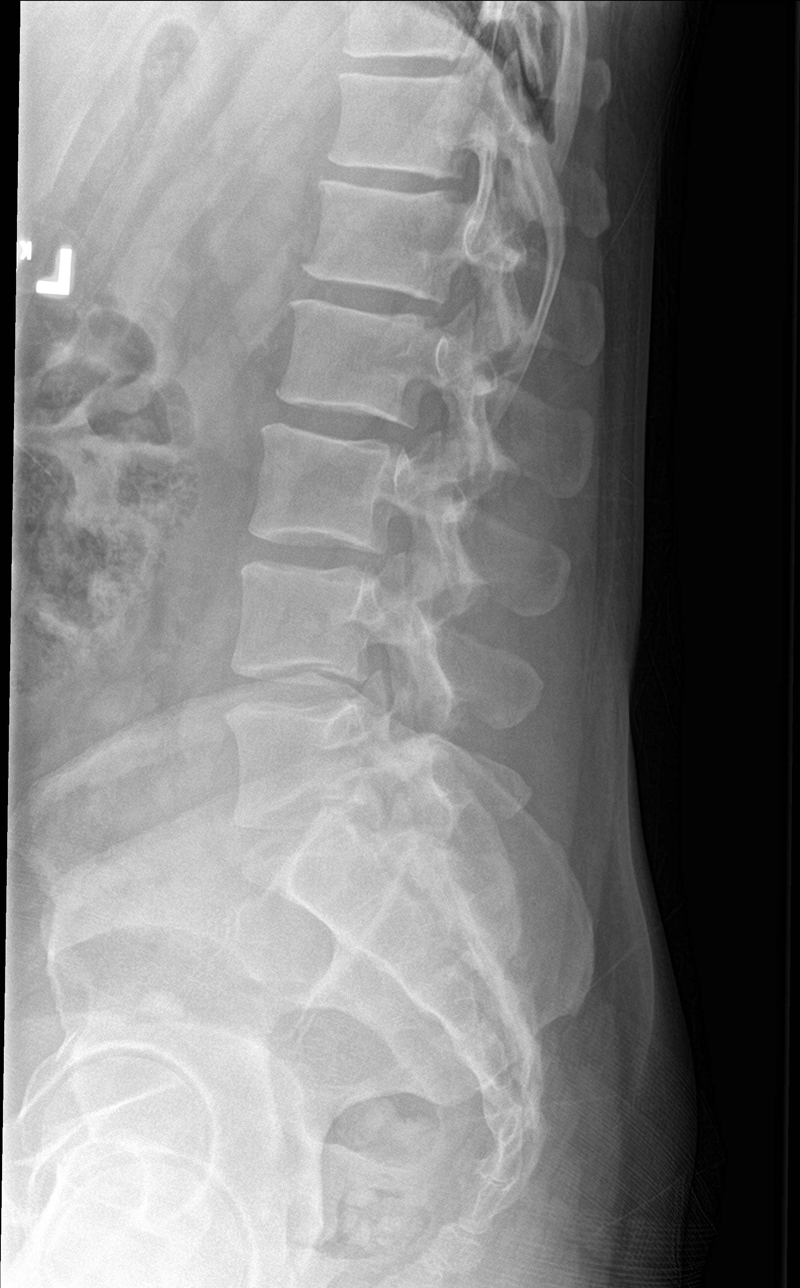

[l-spine spot]
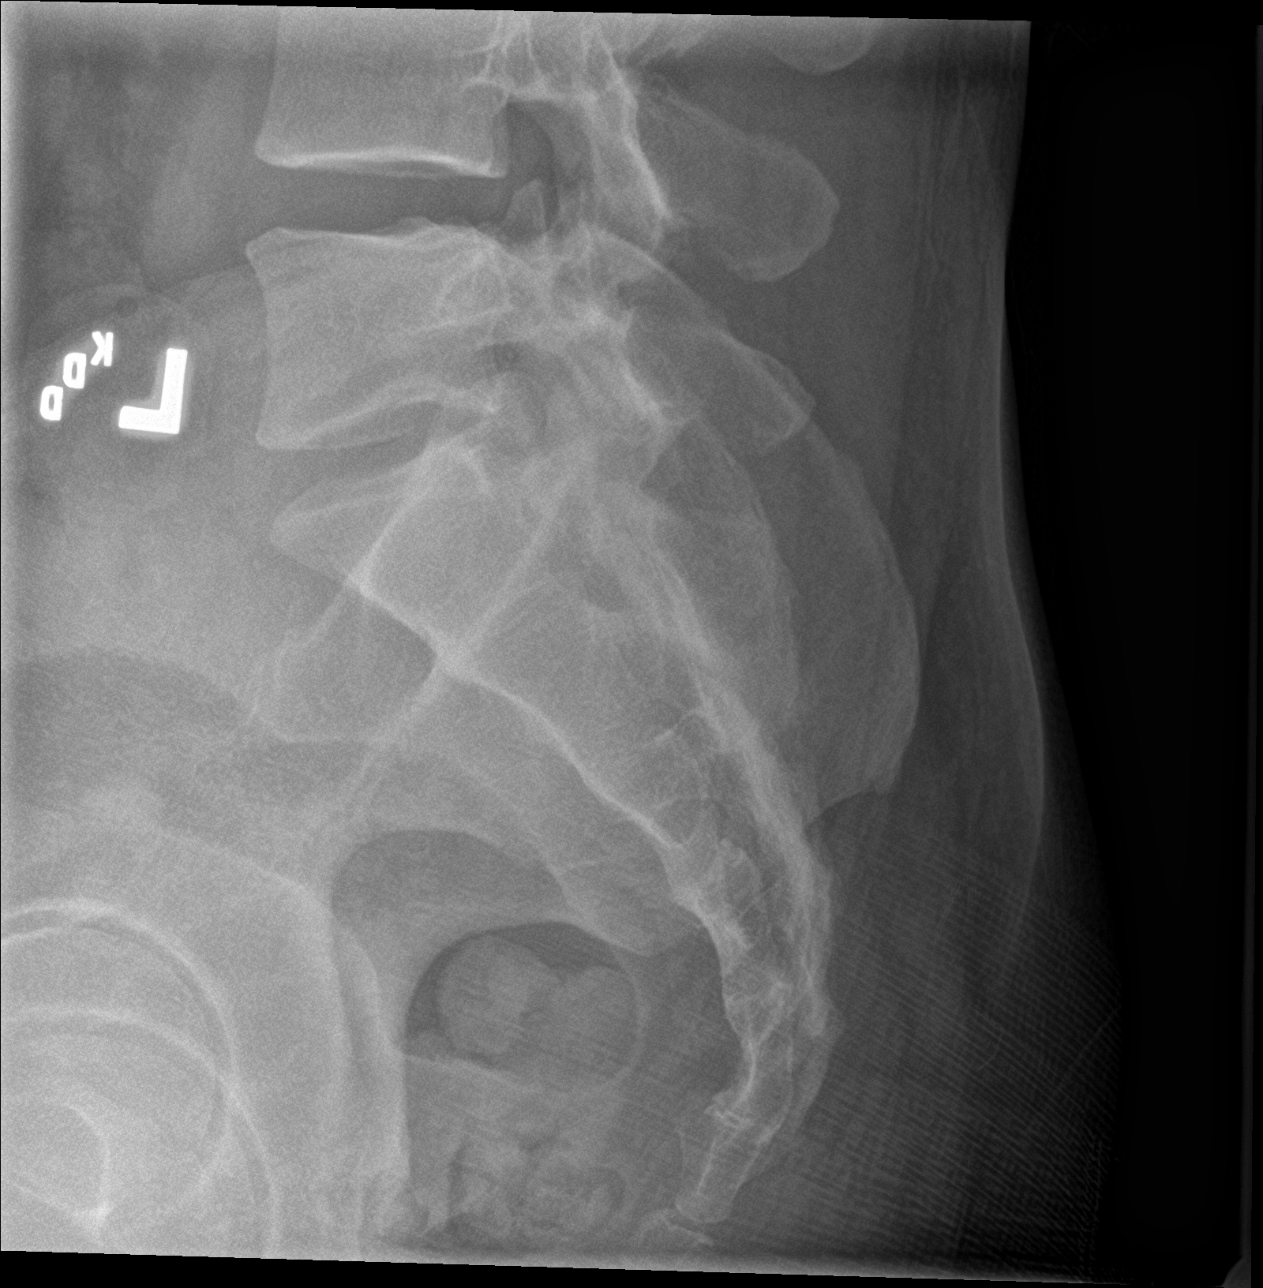

[5 of 5 positions shown; findings below may reference images not displayed]

FINDINGS: There is no evidence of lumbar spine fracture. Alignment is normal.
Minimal degenerative changes at L1-L2 and L2-L3.
IMPRESSION: Minimal degenerative changes.  No acute osseous abnormality.

## 2018-10-22 ENCOUNTER — Other Ambulatory Visit: Payer: Self-pay

## 2018-10-22 ENCOUNTER — Other Ambulatory Visit: Payer: Managed Care, Other (non HMO)

## 2018-10-22 DIAGNOSIS — Z20822 Contact with and (suspected) exposure to covid-19: Secondary | ICD-10-CM

## 2018-10-26 LAB — NOVEL CORONAVIRUS, NAA: SARS-CoV-2, NAA: NOT DETECTED

## 2018-12-10 ENCOUNTER — Other Ambulatory Visit: Payer: Self-pay

## 2018-12-10 DIAGNOSIS — Z20822 Contact with and (suspected) exposure to covid-19: Secondary | ICD-10-CM

## 2018-12-12 LAB — NOVEL CORONAVIRUS, NAA: SARS-CoV-2, NAA: NOT DETECTED

## 2018-12-14 ENCOUNTER — Other Ambulatory Visit: Payer: Self-pay

## 2018-12-14 ENCOUNTER — Emergency Department (HOSPITAL_COMMUNITY): Payer: BC Managed Care – PPO

## 2018-12-14 ENCOUNTER — Emergency Department (HOSPITAL_COMMUNITY)
Admission: EM | Admit: 2018-12-14 | Discharge: 2018-12-14 | Disposition: A | Discharge status: Home / Self Care | Payer: BC Managed Care – PPO | Attending: Emergency Medicine | Admitting: Emergency Medicine

## 2018-12-14 ENCOUNTER — Encounter (HOSPITAL_COMMUNITY): Payer: Self-pay | Admitting: Emergency Medicine

## 2018-12-14 DIAGNOSIS — R0789 Other chest pain: Secondary | ICD-10-CM | POA: Diagnosis not present

## 2018-12-14 DIAGNOSIS — Z87891 Personal history of nicotine dependence: Secondary | ICD-10-CM | POA: Diagnosis not present

## 2018-12-14 DIAGNOSIS — R079 Chest pain, unspecified: Secondary | ICD-10-CM

## 2018-12-14 LAB — TROPONIN I (HIGH SENSITIVITY)
Troponin I (High Sensitivity): 3 ng/L (ref <18)
Troponin I (High Sensitivity): 3 ng/L (ref <18)

## 2018-12-14 LAB — BASIC METABOLIC PANEL
Anion gap: 10 (ref 5–15)
BUN: 18 mg/dL (ref 6–20)
CO2: 27 mmol/L (ref 22–32)
Calcium: 9.2 mg/dL (ref 8.9–10.3)
Chloride: 103 mmol/L (ref 98–111)
Creatinine, Ser: 1.24 mg/dL (ref 0.61–1.24)
GFR calc Af Amer: >60 mL/min (ref >60)
GFR calc non Af Amer: >60 mL/min (ref >60)
Glucose, Bld: 93 mg/dL (ref 70–99)
Potassium: 3.8 mmol/L (ref 3.5–5.1)
Sodium: 140 mmol/L (ref 135–145)

## 2018-12-14 LAB — CBC
HCT: 45.6 % (ref 39.0–52.0)
Hemoglobin: 14.4 g/dL (ref 13.0–17.0)
MCH: 29.3 pg (ref 26.0–34.0)
MCHC: 31.6 g/dL (ref 30.0–36.0)
MCV: 92.9 fL (ref 80.0–100.0)
Platelets: 264 10*3/uL (ref 150–400)
RBC: 4.91 MIL/uL (ref 4.22–5.81)
RDW: 13.3 % (ref 11.5–15.5)
WBC: 7.1 10*3/uL (ref 4.0–10.5)
nRBC: 0 % (ref 0.0–0.2)

## 2018-12-14 NOTE — Discharge Instructions (Addendum)
EKG, chest x-ray, blood work all normal.  Suspect pain is in the muscles of your chest.  Try to get a primary care doctor.

## 2018-12-14 NOTE — ED Triage Notes (Addendum)
Pain to chest on and off for 2 weeks to LT side of chest and LT side, gets better or worse with movement

## 2018-12-14 NOTE — ED Provider Notes (Signed)
[facility] Provider Note   CSN: [id number] Arrival date & time: 12/14/18  1823     History   Chief Complaint Chief Complaint  Patient presents with  . Chest Pain    HPI Sean Dudley is a 43 y.o. male.     Intermittent left anterior lateral chest pain for 2 weeks not associated with activity.  No known history of diabetes, hypertension, coronary artery disease.  Former smoker.  Symptoms not associate with any activity.  Severity is mild to moderate.  Positioning makes pain worse.     Past Medical History:  Diagnosis Date  . Low back pain     There are no active problems to display for this patient.   History reviewed. No pertinent surgical history.      Home Medications    Prior to Admission medications   Medication Sig Start Date End Date Taking? Authorizing Provider  acetaminophen (TYLENOL) 500 MG tablet Take 1,000 mg by mouth daily as needed for mild pain or moderate pain.   Yes [provider]  Multiple Vitamin (MULTIVITAMIN WITH MINERALS) TABS tablet Take 1 tablet by mouth daily.   Yes [provider]    Family History Family History  Problem Relation Age of Onset  . Hypertension Father   . Diabetes Other   . Hypertension Other     Social History Social History   Tobacco Use  . Smoking status: Former Smoker    Packs/day: 0.50    Types: Cigarettes    Quit date: 11/16/2018    Years since quitting: 0.0  . Smokeless tobacco: Never Used  Substance Use Topics  . Alcohol use: Yes    Comment: occasionally  . Drug use: No     Allergies   Patient has no known allergies.   Review of Systems Review of Systems  All other systems reviewed and are negative.    Physical Exam Updated Vital Signs BP (!) 138/95 (BP Location: Right Arm)   Pulse 72   Temp 98.4 F (36.9 C) (Oral)   Resp 19   Ht 5\' 8"  (1.727 m)   Wt 93.4 kg   SpO2 99%   BMI 31.32 kg/m   Physical Exam Vitals signs and nursing note  reviewed.  Constitutional:      Appearance: He is well-developed.  HENT:     Head: Normocephalic and atraumatic.  Eyes:     Conjunctiva/sclera: Conjunctivae normal.  Neck:     Musculoskeletal: Neck supple.  Cardiovascular:     Rate and Rhythm: Normal rate and regular rhythm.  Pulmonary:     Effort: Pulmonary effort is normal.     Breath sounds: Normal breath sounds.  Abdominal:     General: Bowel sounds are normal.     Palpations: Abdomen is soft.  Musculoskeletal: Normal range of motion.  Skin:    General: Skin is warm and dry.  Neurological:     Mental Status: He is alert and oriented to person, place, and time.  Psychiatric:        Behavior: Behavior normal.      ED Treatments / Results  Labs (all labs ordered are listed, but only abnormal results are displayed) Labs Reviewed  BASIC METABOLIC PANEL  CBC  TROPONIN I (HIGH SENSITIVITY)  TROPONIN I (HIGH SENSITIVITY)    EKG EKG Interpretation  Date/Time:  Monday December 14 2018 18:54:08 EDT Ventricular Rate:  74 PR Interval:  154 QRS Duration: 90 QT Interval:  366 QTC Calculation: 406  R Axis:   75 Text Interpretation:  Normal sinus rhythm with sinus arrhythmia Cannot rule out Inferior infarct , age undetermined T wave abnormality, consider anterolateral ischemia Abnormal ECG Confirmed by Nat Christen [phone]) on 12/14/2018 8:35:57 PM   Radiology Dg Chest 2 View  Result Date: 12/14/2018 CLINICAL DATA:  CHEST PAIN, PER ER NOTE, Pain to chest on and off for 2 weeks to LT side of chest and LT side, gets better or worse with movement HISTORY OF LOW back pain EXAM: CHEST - 2 VIEW COMPARISON:  11/23/2016 FINDINGS: Heart size is normal. There are no focal consolidations or pleural effusions. Minimal RIGHT LOWER lobe atelectasis. No pulmonary edema. IMPRESSION: No active cardiopulmonary disease. Electronically Signed   By: Nolon Nations M.D.   On: 12/14/2018 19:45    Procedures Procedures (including critical care time)   Medications Ordered in ED Medications - No data to display   Initial Impression / Assessment and Plan / ED Course  I have reviewed the triage vital signs and the nursing notes.  Pertinent labs & imaging results that were available during my care of the patient were reviewed by me and considered in my medical decision making (see chart for details).        Patient is low risk for ACS or PE.  Screening labs, troponin, EKG, chest x-ray all negative.  Final Clinical Impressions(s) / ED Diagnoses   Final diagnoses:  Chest pain, unspecified type    ED Discharge Orders    None       Nat Christen, MD 12/14/18 2152

## 2019-03-10 ENCOUNTER — Ambulatory Visit
Admission: EM | Admit: 2019-03-10 | Discharge: 2019-03-10 | Disposition: A | Discharge status: Home / Self Care | Payer: BC Managed Care – PPO | Attending: Emergency Medicine | Admitting: Emergency Medicine

## 2019-03-10 DIAGNOSIS — Z20828 Contact with and (suspected) exposure to other viral communicable diseases: Secondary | ICD-10-CM | POA: Diagnosis not present

## 2019-03-10 DIAGNOSIS — Z20822 Contact with and (suspected) exposure to covid-19: Secondary | ICD-10-CM

## 2019-03-10 DIAGNOSIS — J392 Other diseases of pharynx: Secondary | ICD-10-CM

## 2019-03-10 MED ORDER — CETIRIZINE HCL 10 MG PO CHEW: 10.0000 mg | CHEWABLE_TABLET | Freq: Every day | ORAL | 0 refills | Status: AC

## 2019-03-10 NOTE — Discharge Instructions (Signed)
COVID testing ordered.  It will take between 5-7 days for test results.  Someone will contact you regarding abnormal results.    In the meantime: You should remain isolated in your home for 10 days from symptom onset AND greater than 72 hours after symptoms resolution (absence of fever without the use of fever-reducing medication and improvement in respiratory symptoms), whichever is longer Get plenty of rest and push fluids Zyrtec prescribed for nasal congestion, runny nose, and/or sore throat Use OTC medications like ibuprofen or tylenol as needed fever or pain Call or go to the ED if you have any new or worsening symptoms such as fever, worsening cough, shortness of breath, chest tightness, chest pain, turning blue, changes in mental status, etc..Marland Kitchen

## 2019-03-10 NOTE — ED Triage Notes (Signed)
Pt here for covid testing after positive exposure , pt has developed scratchy throat but no other symptoms

## 2019-03-10 NOTE — ED Provider Notes (Addendum)
[facility]   [id number] 03/10/19 Arrival Time: 1449   CC: COVID symptoms  SUBJECTIVE: History from: patient.  HARVIS MABUS is a 43 y.o. male who presents with scratchy throat x 3 days ago.  Admits to positive COVID exposure to fiance.  She tested positive on Monday, 2 days ago.  Denies recent travel.  Denies aggravating or alleviating symptoms.  Reports previous symptoms in the past.   Denies fever, chills, fatigue, sinus pain, rhinorrhea, cough, SOB, wheezing, chest pain, nausea, changes in bowel or bladder habits.    ROS: As per HPI.  All other pertinent ROS negative.     Past Medical History:  Diagnosis Date  . Low back pain    No past surgical history on file. No Known Allergies No current facility-administered medications on file prior to encounter.    Current Outpatient Medications on File Prior to Encounter  Medication Sig Dispense Refill  . acetaminophen (TYLENOL) 500 MG tablet Take 1,000 mg by mouth daily as needed for mild pain or moderate pain.    . Multiple Vitamin (MULTIVITAMIN WITH MINERALS) TABS tablet Take 1 tablet by mouth daily.     Social History   Socioeconomic History  . Marital status: Single    Spouse name: Not on file  . Number of children: Not on file  . Years of education: Not on file  . Highest education level: Not on file  Occupational History  . Not on file  Social Needs  . Financial resource strain: Not on file  . Food insecurity    Worry: Not on file    Inability: Not on file  . Transportation needs    Medical: Not on file    Non-medical: Not on file  Tobacco Use  . Smoking status: Former Smoker    Packs/day: 0.50    Types: Cigarettes    Quit date: 11/16/2018    Years since quitting: 0.3  . Smokeless tobacco: Never Used  Substance and Sexual Activity  . Alcohol use: Yes    Comment: occasionally  . Drug use: No  . Sexual activity: Not on file  Lifestyle  . Physical activity    Days per week: Not on file    Minutes  per session: Not on file  . Stress: Not on file  Relationships  . Social Musician on phone: Not on file    Gets together: Not on file    Attends religious service: Not on file    Active member of club or organization: Not on file    Attends meetings of clubs or organizations: Not on file    Relationship status: Not on file  . Intimate partner violence    Fear of current or ex partner: Not on file    Emotionally abused: Not on file    Physically abused: Not on file    Forced sexual activity: Not on file  Other Topics Concern  . Not on file  Social History Narrative  . Not on file   Family History  Problem Relation Age of Onset  . Hypertension Father   . Diabetes Other   . Hypertension Other     OBJECTIVE:  Vitals:   03/10/19 1504  BP: 132/85  Pulse: 92  Resp: 20  Temp: 99.5 F (37.5 C)  SpO2: 95%     General appearance: alert; well-appearing, nontoxic; speaking in full sentences and tolerating own secretions HEENT: NCAT; Ears: EACs clear, TMs pearly gray; Eyes: PERRL.  EOM  grossly intact.  Nose: nares patent without rhinorrhea, turbinates swollen and erythematous, Throat: oropharynx clear, tonsils non erythematous or enlarged, uvula midline  Neck: supple without LAD Lungs: unlabored respirations, symmetrical air entry; cough: absent; no respiratory distress; CTAB Heart: regular rate and rhythm.  Skin: warm and dry Psychological: alert and cooperative; normal mood and affect  ASSESSMENT & PLAN:  1. Suspected COVID-19 virus infection   2. Exposure to COVID-19 virus   3. Throat irritation     Meds ordered this encounter  Medications  . cetirizine (ZYRTEC) 10 MG chewable tablet    Sig: Chew 1 tablet (10 mg total) by mouth daily.    Dispense:  20 tablet    Refill:  0    Order Specific Question:   Supervising Provider    Answer:   Raylene Everts [[id number]]    COVID testing ordered.  It will take between 5-7 days for test results.  Someone will  contact you regarding abnormal results.    In the meantime: You should remain isolated in your home for 10 days from symptom onset AND greater than 72 hours after symptoms resolution (absence of fever without the use of fever-reducing medication and improvement in respiratory symptoms), whichever is longer OR 14 days from exposure Get plenty of rest and push fluids Zyrtec prescribed for nasal congestion, runny nose, and/or sore throat Use OTC medications like ibuprofen or tylenol as needed fever or pain Call or go to the ED if you have any new or worsening symptoms such as fever, worsening cough, shortness of breath, chest tightness, chest pain, turning blue, changes in mental status, etc...   Reviewed expectations re: course of current medical issues. Questions answered. Outlined signs and symptoms indicating need for more acute intervention. Patient verbalized understanding. After Visit Summary given.         Lestine Box, PA-C 03/10/19 [place], [place], [place] 03/10/19 1558

## 2019-03-12 LAB — NOVEL CORONAVIRUS, NAA: SARS-CoV-2, NAA: DETECTED — AB

## 2019-03-14 ENCOUNTER — Telehealth (HOSPITAL_COMMUNITY): Payer: Self-pay | Admitting: Emergency Medicine

## 2019-03-14 NOTE — Telephone Encounter (Signed)
Positive covid detected on sample. Patient contacted by phone and made aware of    results. Pt verbalized understanding and had all questions answered. Quarantine ends Dec 5th if no fever and symptoms improved.

## 2019-03-17 ENCOUNTER — Ambulatory Visit
Admission: EM | Admit: 2019-03-17 | Discharge: 2019-03-17 | Disposition: A | Discharge status: Home / Self Care | Payer: BC Managed Care – PPO | Source: Home / Self Care

## 2019-03-17 ENCOUNTER — Emergency Department (HOSPITAL_COMMUNITY)
Admission: EM | Admit: 2019-03-17 | Discharge: 2019-03-17 | Disposition: A | Discharge status: Home / Self Care | Payer: BC Managed Care – PPO | Attending: Emergency Medicine | Admitting: Emergency Medicine

## 2019-03-17 ENCOUNTER — Other Ambulatory Visit: Payer: Self-pay

## 2019-03-17 ENCOUNTER — Encounter (HOSPITAL_COMMUNITY): Payer: Self-pay | Admitting: *Deleted

## 2019-03-17 ENCOUNTER — Emergency Department (HOSPITAL_COMMUNITY): Payer: BC Managed Care – PPO

## 2019-03-17 DIAGNOSIS — U071 COVID-19: Secondary | ICD-10-CM | POA: Insufficient documentation

## 2019-03-17 DIAGNOSIS — R509 Fever, unspecified: Secondary | ICD-10-CM | POA: Diagnosis present

## 2019-03-17 DIAGNOSIS — Z87891 Personal history of nicotine dependence: Secondary | ICD-10-CM | POA: Insufficient documentation

## 2019-03-17 LAB — COMPREHENSIVE METABOLIC PANEL
ALT: 30 U/L (ref 0–44)
AST: 33 U/L (ref 15–41)
Albumin: 4 g/dL (ref 3.5–5.0)
Alkaline Phosphatase: 76 U/L (ref 38–126)
Anion gap: 13 (ref 5–15)
BUN: 19 mg/dL (ref 6–20)
CO2: 28 mmol/L (ref 22–32)
Calcium: 8.7 mg/dL — ABNORMAL LOW (ref 8.9–10.3)
Chloride: 95 mmol/L — ABNORMAL LOW (ref 98–111)
Creatinine, Ser: 1.51 mg/dL — ABNORMAL HIGH (ref 0.61–1.24)
GFR calc Af Amer: >60 mL/min (ref >60)
GFR calc non Af Amer: 57 mL/min — ABNORMAL LOW (ref >60)
Glucose, Bld: 115 mg/dL — ABNORMAL HIGH (ref 70–99)
Potassium: 4.2 mmol/L (ref 3.5–5.1)
Sodium: 136 mmol/L (ref 135–145)
Total Bilirubin: 0.8 mg/dL (ref 0.3–1.2)
Total Protein: 8.2 g/dL — ABNORMAL HIGH (ref 6.5–8.1)

## 2019-03-17 LAB — CBC WITH DIFFERENTIAL/PLATELET
Abs Immature Granulocytes: 0.01 10*3/uL (ref 0.00–0.07)
Basophils Absolute: 0 10*3/uL (ref 0.0–0.1)
Basophils Relative: 0 %
Eosinophils Absolute: 0 10*3/uL (ref 0.0–0.5)
Eosinophils Relative: 0 %
HCT: 49.2 % (ref 39.0–52.0)
Hemoglobin: 15.8 g/dL (ref 13.0–17.0)
Immature Granulocytes: 0 %
Lymphocytes Relative: 26 %
Lymphs Abs: 1.3 10*3/uL (ref 0.7–4.0)
MCH: 29.3 pg (ref 26.0–34.0)
MCHC: 32.1 g/dL (ref 30.0–36.0)
MCV: 91.3 fL (ref 80.0–100.0)
Monocytes Absolute: 0.4 10*3/uL (ref 0.1–1.0)
Monocytes Relative: 8 %
Neutro Abs: 3.2 10*3/uL (ref 1.7–7.7)
Neutrophils Relative %: 66 %
Platelets: 194 10*3/uL (ref 150–400)
RBC: 5.39 MIL/uL (ref 4.22–5.81)
RDW: 13.4 % (ref 11.5–15.5)
WBC: 4.8 10*3/uL (ref 4.0–10.5)
nRBC: 0 % (ref 0.0–0.2)

## 2019-03-17 LAB — FIBRINOGEN: Fibrinogen: 519 mg/dL — ABNORMAL HIGH (ref 210–475)

## 2019-03-17 LAB — C-REACTIVE PROTEIN: CRP: 6.8 mg/dL — ABNORMAL HIGH (ref <1.0)

## 2019-03-17 LAB — LACTIC ACID, PLASMA
Lactic Acid, Venous: 1.8 mmol/L (ref 0.5–1.9)
Lactic Acid, Venous: 1.1 mmol/L (ref 0.5–1.9)

## 2019-03-17 LAB — FERRITIN: Ferritin: 208 ng/mL (ref 24–336)

## 2019-03-17 LAB — PROCALCITONIN: Procalcitonin: <0.1 ng/mL

## 2019-03-17 LAB — TRIGLYCERIDES: Triglycerides: 102 mg/dL (ref <150)

## 2019-03-17 LAB — LACTATE DEHYDROGENASE: LDH: 265 U/L — ABNORMAL HIGH (ref 98–192)

## 2019-03-17 LAB — D-DIMER, QUANTITATIVE: D-Dimer, Quant: 0.34 ug/mL-FEU (ref 0.00–0.50)

## 2019-03-17 MED ORDER — AMOXICILLIN 250 MG PO CAPS
500.0000 mg | ORAL_CAPSULE | Freq: Once | ORAL | Status: AC
  Administered 2019-03-17: 500 mg via ORAL
  Filled 2019-03-17: qty 2

## 2019-03-17 MED ORDER — AMOXICILLIN 500 MG PO CAPS: 500.0000 mg | ORAL_CAPSULE | Freq: Three times a day (TID) | ORAL | 0 refills | Status: AC

## 2019-03-17 MED ORDER — DOXYCYCLINE HYCLATE 100 MG PO CAPS: 100.0000 mg | ORAL_CAPSULE | Freq: Two times a day (BID) | ORAL | 0 refills | Status: AC

## 2019-03-17 MED ORDER — SODIUM CHLORIDE 0.9 % IV BOLUS
1000.0000 mL | Freq: Once | INTRAVENOUS | Status: AC
  Administered 2019-03-17: 1000 mL via INTRAVENOUS

## 2019-03-17 MED ORDER — DOXYCYCLINE HYCLATE 100 MG PO TABS
100.0000 mg | ORAL_TABLET | Freq: Once | ORAL | Status: AC
  Administered 2019-03-17: 100 mg via ORAL
  Filled 2019-03-17: qty 1

## 2019-03-17 MED ORDER — ACETAMINOPHEN 325 MG PO TABS
650.0000 mg | ORAL_TABLET | Freq: Once | ORAL | Status: AC
  Administered 2019-03-17: 650 mg via ORAL
  Filled 2019-03-17: qty 2

## 2019-03-17 NOTE — Discharge Instructions (Signed)
Use Tylenol or Motrin as needed for aches and fever.  Keep yourself quarantined at home until you are fever free for 7 days and feeling better.  Return to the ED with chest pain, shortness of breath or other concerns.

## 2019-03-17 NOTE — ED Provider Notes (Signed)
[facility] EMERGENCY DEPARTMENT Provider Note   CSN: [id number] Arrival date & time: 03/17/19  1350     History   Chief Complaint Chief Complaint  Patient presents with  . Fever    HPI Sean Dudley is a 43 y.o. male.     Patient here with fever and body aches.  States he was diagnosed with Covid on November 25.  He was sick for several days prior to this with scratchy throat and cough.  His fiance who tested positive.  He came to the ED today because of fever at home up to 103.  He did take Tylenol prior to arrival as well as in triage.  He denies any chest pain, shortness of breath, nausea, vomiting, diarrhea.  No pain with urination or blood in urine.  He denies any headache.  He does have a generalized weakness and body aches.  Denies any chronic medical problems and is not taking regular medications.  States he is here because he was told to return if he had a fever.   Fever Associated symptoms: congestion, myalgias and rhinorrhea   Associated symptoms: no chest pain, no cough, no dysuria, no headaches, no nausea, no rash and no vomiting     Past Medical History:  Diagnosis Date  . Low back pain     There are no active problems to display for this patient.   History reviewed. No pertinent surgical history.      Home Medications    Prior to Admission medications   Medication Sig Start Date End Date Taking? Authorizing Provider  acetaminophen (TYLENOL) 500 MG tablet Take 1,000 mg by mouth daily as needed for mild pain or moderate pain.    [provider]  cetirizine (ZYRTEC) 10 MG chewable tablet Chew 1 tablet (10 mg total) by mouth daily. 03/10/19   Wurst, [place], PA-C  Multiple Vitamin (MULTIVITAMIN WITH MINERALS) TABS tablet Take 1 tablet by mouth daily.    [provider]    Family History Family History  Problem Relation Age of Onset  . Hypertension Father   . Diabetes Other   . Hypertension Other     Social History  Social History   Tobacco Use  . Smoking status: Former Smoker    Packs/day: 0.50    Types: Cigarettes    Quit date: 11/16/2018    Years since quitting: 0.3  . Smokeless tobacco: Never Used  Substance Use Topics  . Alcohol use: Yes    Comment: occasionally  . Drug use: No     Allergies   Patient has no known allergies.   Review of Systems Review of Systems  Constitutional: Positive for activity change, appetite change, fatigue and fever.  HENT: Positive for congestion and rhinorrhea.   Respiratory: Negative for cough and shortness of breath.   Cardiovascular: Negative for chest pain.  Gastrointestinal: Negative for abdominal pain, nausea and vomiting.  Genitourinary: Negative for dysuria and hematuria.  Musculoskeletal: Positive for arthralgias and myalgias.  Skin: Negative for rash.  Neurological: Positive for weakness. Negative for dizziness, light-headedness and headaches.    all other systems are negative except as noted in the HPI and PMH.    Physical Exam Updated Vital Signs BP 118/81   Pulse 97   Temp 99.4 F (37.4 C) (Oral)   Resp 16   Ht 5\' 8"  (1.727 m)   Wt 95.3 kg   SpO2 99%   BMI 31.93 kg/m   Physical Exam Vitals signs and nursing note  reviewed.  Constitutional:      General: He is not in acute distress.    Appearance: Normal appearance. He is well-developed and normal weight. He is not ill-appearing.  HENT:     Head: Normocephalic and atraumatic.     Mouth/Throat:     Pharynx: No oropharyngeal exudate.  Eyes:     Conjunctiva/sclera: Conjunctivae normal.     Pupils: Pupils are equal, round, and reactive to light.  Neck:     Musculoskeletal: Normal range of motion and neck supple.     Comments: No meningismus. Cardiovascular:     Rate and Rhythm: Normal rate and regular rhythm.     Heart sounds: Normal heart sounds. No murmur.  Pulmonary:     Effort: Pulmonary effort is normal. No respiratory distress.     Breath sounds: Normal breath  sounds. No wheezing.  Chest:     Chest wall: No tenderness.  Abdominal:     Palpations: Abdomen is soft.     Tenderness: There is no abdominal tenderness. There is no guarding or rebound.  Musculoskeletal: Normal range of motion.        General: No tenderness.  Skin:    General: Skin is warm.     Capillary Refill: Capillary refill takes less than 2 seconds.  Neurological:     General: No focal deficit present.     Mental Status: He is alert and oriented to person, place, and time. Mental status is at baseline.     Cranial Nerves: No cranial nerve deficit.     Motor: No abnormal muscle tone.     Coordination: Coordination normal.     Comments: No ataxia on finger to nose bilaterally. No pronator drift. 5/5 strength throughout. CN 2-12 intact.Equal grip strength. Sensation intact.   Psychiatric:        Behavior: Behavior normal.      ED Treatments / Results  Labs (all labs ordered are listed, but only abnormal results are displayed) Labs Reviewed  COMPREHENSIVE METABOLIC PANEL - Abnormal; Notable for the following components:      Result Value   Chloride 95 (*)    Glucose, Bld 115 (*)    Creatinine, Ser 1.51 (*)    Calcium 8.7 (*)    Total Protein 8.2 (*)    GFR calc non Af Amer 57 (*)    All other components within normal limits  LACTATE DEHYDROGENASE - Abnormal; Notable for the following components:   LDH 265 (*)    All other components within normal limits  FIBRINOGEN - Abnormal; Notable for the following components:   Fibrinogen 519 (*)    All other components within normal limits  C-REACTIVE PROTEIN - Abnormal; Notable for the following components:   CRP 6.8 (*)    All other components within normal limits  CULTURE, BLOOD (ROUTINE X 2)  CULTURE, BLOOD (ROUTINE X 2)  LACTIC ACID, PLASMA  LACTIC ACID, PLASMA  CBC WITH DIFFERENTIAL/PLATELET  D-DIMER, QUANTITATIVE (NOT AT [facility])  PROCALCITONIN  FERRITIN  TRIGLYCERIDES    EKG EKG Interpretation  Date/Time:   Wednesday March 17 2019 16:18:12 EST Ventricular Rate:  95 PR Interval:    QRS Duration: 76 QT Interval:  332 QTC Calculation: 418 R Axis:   72 Text Interpretation: Sinus rhythm Consider right atrial enlargement Nonspecific T abnormalities, diffuse leads No significant change was found Confirmed by Glynn Octave [phone]) on 03/17/2019 4:30:04 PM   Radiology Dg Chest Portable 1 View  Result Date: 03/17/2019 CLINICAL DATA:  COVID-19  positive, fever, dyspnea, weakness EXAM: PORTABLE CHEST 1 VIEW COMPARISON:  12/14/2018 chest radiograph. FINDINGS: Low lung volumes. Stable cardiomediastinal silhouette with normal heart size. No pneumothorax. No pleural effusion. Patchy opacities throughout the right greater than left lungs, new. IMPRESSION: Patchy opacities throughout the right greater than left lungs, new, compatible with multilobar pneumonia. Electronically Signed   By: Delbert PhenixJason A Poff M.D.   On: 03/17/2019 16:17    Procedures Procedures (including critical care time)  Medications Ordered in ED Medications  acetaminophen (TYLENOL) tablet 650 mg (650 mg Oral Given 03/17/19 1427)     Initial Impression / Assessment and Plan / ED Course  I have reviewed the triage vital signs and the nursing notes.  Pertinent labs & imaging results that were available during my care of the patient were reviewed by me and considered in my medical decision making (see chart for details).       Patient with known coronavirus tested positive on November 25.  Here with fever and body aches.  He is well-appearing and nontoxic.  No increased work of breathing.  Lungs are clear. Fever has improved and antipyretics given in triage.  Lactate is normal.  Chest x-ray shows mild bilateral opacities consistent with coronavirus diagnosis. Favor viral pneumonia but will cover with antibiotics.  Tachycardia and fever have resolved.  He has no hypoxia with ambulation. His labs are otherwise reassuring with negative  D-dimer.  Patient able to ambulate without desaturation.  He is well-appearing and nontoxic.  No hypoxia. No indication for steroids.  Heart rate and fever normalized.  Discussed p.o. hydration at home, antipyretics and, quarantine until he is fever free for 7 days.  Return precautions discussed  Amparo BristolVernon Santonio Pitz was evaluated in Emergency Department on 03/17/2019 for the symptoms described in the history of present illness. He was evaluated in the context of the global COVID-19 pandemic, which necessitated consideration that the patient might be at risk for infection with the SARS-CoV-2 virus that causes COVID-19. Institutional protocols and algorithms that pertain to the evaluation of patients at risk for COVID-19 are in a state of rapid change based on information released by regulatory bodies including the CDC and federal and state organizations. These policies and algorithms were followed during the patient's care in the ED.   Final Clinical Impressions(s) / ED Diagnoses   Final diagnoses:  COVID-19 virus infection    ED Discharge Orders    None       Vaibhav Fogleman, Jeannett SeniorStephen, MD 03/17/19 1954

## 2019-03-17 NOTE — ED Notes (Signed)
Patient ambulated in room since Covid positive, we cannot ambulate in hallway.  o2 sat dropped to 93%.  Patient also stated he needed water in order to do a urine specimen.  Patient given cup of water.

## 2019-03-17 NOTE — ED Notes (Signed)
Provider made aware, determined pt will need higher level of care and will go to ED via private vehicle

## 2019-03-17 NOTE — ED Triage Notes (Signed)
Pt  Is covid positive and is concerned about having fevers, states SOB is mild

## 2019-03-17 NOTE — ED Triage Notes (Signed)
Pt states that he tested positive for covid on Friday, having fever, sob, weakness,

## 2019-03-22 LAB — CULTURE, BLOOD (ROUTINE X 2)
Culture: NO GROWTH
Culture: NO GROWTH
Special Requests: ADEQUATE
Special Requests: ADEQUATE

## 2019-03-29 ENCOUNTER — Ambulatory Visit
Admission: EM | Admit: 2019-03-29 | Discharge: 2019-03-29 | Disposition: A | Discharge status: Home / Self Care | Payer: BC Managed Care – PPO | Attending: Emergency Medicine | Admitting: Emergency Medicine

## 2019-03-29 ENCOUNTER — Other Ambulatory Visit: Payer: Self-pay

## 2019-03-29 DIAGNOSIS — Z20822 Contact with and (suspected) exposure to covid-19: Secondary | ICD-10-CM

## 2019-03-29 DIAGNOSIS — Z20828 Contact with and (suspected) exposure to other viral communicable diseases: Secondary | ICD-10-CM | POA: Diagnosis not present

## 2019-03-29 DIAGNOSIS — R0602 Shortness of breath: Secondary | ICD-10-CM | POA: Diagnosis not present

## 2019-03-29 DIAGNOSIS — Z03818 Encounter for observation for suspected exposure to other biological agents ruled out: Secondary | ICD-10-CM

## 2019-03-29 MED ORDER — ALBUTEROL SULFATE HFA 108 (90 BASE) MCG/ACT IN AERS: 1.0000 | INHALATION_SPRAY | Freq: Four times a day (QID) | RESPIRATORY_TRACT | 0 refills | Status: AC | PRN

## 2019-03-29 NOTE — ED Provider Notes (Signed)
[facility]    CSN: [id number] Arrival date & time: 03/29/19  7829      History   Chief Complaint No chief complaint on file.   HPI Sean Dudley is a 43 y.o. male.   Sean Dudley 43 years old male presents to the urgent care  for COVID testing.  Patient tested positive for COVID-19 2 weeks ago and his job requires a negative test to return.  Denies sick exposure to COVID, flu or strep.  Denies recent travel.  Denies aggravating or alleviating symptoms.  Denies previous COVID infection.   Denies fever, chills, fatigue, nasal congestion, rhinorrhea, sore throat, cough, SOB, wheezing, chest pain, nausea, vomiting, changes in bowel or bladder habits.     The history is provided by the patient. No language interpreter was used.    Past Medical History:  Diagnosis Date  . Low back pain     There are no problems to display for this patient.   History reviewed. No pertinent surgical history.     Home Medications    Prior to Admission medications   Medication Sig Start Date End Date Taking? Authorizing Provider  acetaminophen (TYLENOL) 500 MG tablet Take 1,000 mg by mouth daily as needed for mild pain or moderate pain.    [provider]  amoxicillin (AMOXIL) 500 MG capsule Take 1 capsule (500 mg total) by mouth 3 (three) times daily. 03/17/19   Rancour, Jeannett Senior, MD  cetirizine (ZYRTEC) 10 MG chewable tablet Chew 1 tablet (10 mg total) by mouth daily. 03/10/19   Wurst, [place], PA-C  doxycycline (VIBRAMYCIN) 100 MG capsule Take 1 capsule (100 mg total) by mouth 2 (two) times daily. 03/17/19   Rancour, Jeannett Senior, MD  Multiple Vitamin (MULTIVITAMIN WITH MINERALS) TABS tablet Take 1 tablet by mouth daily.    [provider]    Family History Family History  Problem Relation Age of Onset  . Hypertension Father   . Diabetes Other   . Hypertension Other     Social History Social History   Tobacco Use  . Smoking status: Former Smoker   Packs/day: 0.50    Types: Cigarettes    Quit date: 11/16/2018    Years since quitting: 0.3  . Smokeless tobacco: Never Used  Substance Use Topics  . Alcohol use: Yes    Comment: occasionally  . Drug use: No     Allergies   Patient has no known allergies.   Review of Systems Review of Systems  Constitutional: Negative.   HENT: Negative.   Respiratory: Negative.   Cardiovascular: Negative.   Gastrointestinal: Negative.   Neurological: Negative.   ROS: All other are negatives   Physical Exam Triage Vital Signs ED Triage Vitals  Enc Vitals Group     BP 03/29/19 0935 111/72     Pulse Rate 03/29/19 0935 83     Resp 03/29/19 0935 12     Temp 03/29/19 0935 98.9 F (37.2 C)     Temp src --      SpO2 03/29/19 0935 95 %     Weight --      Height --      Head Circumference --      Peak Flow --      Pain Score 03/29/19 0934 0     Pain Loc --      Pain Edu? --      Excl. in GC? --    No data found.  Updated Vital Signs BP 111/72  Pulse 83   Temp 98.9 F (37.2 C)   Resp 12   SpO2 95%   Visual Acuity Right Eye Distance:   Left Eye Distance:   Bilateral Distance:    Right Eye Near:   Left Eye Near:    Bilateral Near:     Physical Exam Constitutional:      General: He is not in acute distress.    Appearance: Normal appearance. He is normal weight. He is not ill-appearing or toxic-appearing.  HENT:     Head: Normocephalic.     Right Ear: Tympanic membrane, ear canal and external ear normal. There is no impacted cerumen.     Left Ear: Ear canal and external ear normal. There is no impacted cerumen.     Nose: Nose normal. No congestion.     Mouth/Throat:     Mouth: Mucous membranes are moist.     Pharynx: No oropharyngeal exudate or posterior oropharyngeal erythema.  Cardiovascular:     Rate and Rhythm: Normal rate and regular rhythm.     Pulses: Normal pulses.     Heart sounds: Normal heart sounds. No murmur.  Pulmonary:     Effort: Pulmonary effort is  normal. No respiratory distress.     Breath sounds: No wheezing or rhonchi.  Chest:     Chest wall: No tenderness.  Abdominal:     General: Abdomen is flat. Bowel sounds are normal. There is no distension.     Palpations: There is no mass.  Skin:    Capillary Refill: Capillary refill takes less than 2 seconds.  Neurological:     Mental Status: He is alert and oriented to person, place, and time.      UC Treatments / Results  Labs (all labs ordered are listed, but only abnormal results are displayed) Labs Reviewed  NOVEL CORONAVIRUS, NAA    EKG   Radiology No results found.  Procedures Procedures (including critical care time)  Medications Ordered in UC Medications - No data to display  Initial Impression / Assessment and Plan / UC Course  I have reviewed the triage vital signs and the nursing notes.  Pertinent labs & imaging results that were available during my care of the patient were reviewed by me and considered in my medical decision making (see chart for details).    Patient stable for discharge. Benign physical exam.  Patient will be called if Covid test result is abnormal.  Final Clinical Impressions(s) / UC Diagnoses   Final diagnoses:  Suspected COVID-19 virus infection     Discharge Instructions     COVID testing ordered.  It will take between 2-7 days for test results.  Someone will contact you regarding abnormal results.    In the meantime: You should remain isolated in your home for 10 days from symptom onset Get plenty of rest and push fluids Use medications daily for symptom relief Use OTC medications like ibuprofen or tylenol as needed fever or pain Call or go to the ED if you have any new or worsening symptoms such as fever, worsening cough, shortness of breath, chest tightness, chest pain, turning blue, changes in mental status, etc...    ED Prescriptions    None     PDMP not reviewed this encounter.   Emerson Monte,  FNP 03/29/19 1010

## 2019-03-29 NOTE — Discharge Instructions (Addendum)
COVID testing ordered.  It will take between 2-7 days for test results.  Someone will contact you regarding abnormal results.    Advised patient to take ProAir for shortness of breath and to increase his activity as tolerated.  In the meantime: Get plenty of rest and push fluids Use medications daily for symptom relief Use OTC medications like ibuprofen or tylenol as needed fever or pain Call or go to the ED if you have any new or worsening symptoms such as fever, worsening cough, shortness of breath, chest tightness, chest pain, turning blue, changes in mental status, etc..Marland Kitchen

## 2019-03-29 NOTE — ED Triage Notes (Signed)
Pt needs covid retesting for work, pt is feeling better but still has some mild sob

## 2019-03-31 LAB — NOVEL CORONAVIRUS, NAA: SARS-CoV-2, NAA: NOT DETECTED

## 2019-11-30 ENCOUNTER — Other Ambulatory Visit: Payer: Self-pay

## 2019-11-30 ENCOUNTER — Ambulatory Visit
Admission: EM | Admit: 2019-11-30 | Discharge: 2019-11-30 | Disposition: A | Discharge status: Home / Self Care | Payer: BC Managed Care – PPO | Attending: Emergency Medicine | Admitting: Emergency Medicine

## 2019-11-30 DIAGNOSIS — Z1152 Encounter for screening for COVID-19: Secondary | ICD-10-CM

## 2019-11-30 NOTE — ED Triage Notes (Signed)
Pt here for covid test , no symptoms  

## 2019-12-01 LAB — SARS-COV-2, NAA 2 DAY TAT

## 2019-12-01 LAB — NOVEL CORONAVIRUS, NAA: SARS-CoV-2, NAA: NOT DETECTED

## 2019-12-01 LAB — SPECIMEN STATUS REPORT

## 2020-08-15 ENCOUNTER — Other Ambulatory Visit: Payer: Self-pay

## 2020-08-15 ENCOUNTER — Ambulatory Visit
Admission: EM | Admit: 2020-08-15 | Discharge: 2020-08-15 | Disposition: A | Discharge status: Home / Self Care | Payer: BC Managed Care – PPO | Attending: Family Medicine | Admitting: Family Medicine

## 2020-08-15 DIAGNOSIS — Z1152 Encounter for screening for COVID-19: Secondary | ICD-10-CM

## 2020-08-16 LAB — NOVEL CORONAVIRUS, NAA: SARS-CoV-2, NAA: NOT DETECTED
